# Patient Record
Sex: Male | Born: 1990
Health system: Southern US, Community
[De-identification: ages and names within clinical notes are randomized; demographics above are authoritative.]

## PROBLEM LIST (undated history)

## (undated) DIAGNOSIS — M5126 Other intervertebral disc displacement, lumbar region: Secondary | ICD-10-CM

## (undated) DIAGNOSIS — A048 Other specified bacterial intestinal infections: Secondary | ICD-10-CM

## (undated) HISTORY — DX: Other specified bacterial intestinal infections: A04.8

## (undated) HISTORY — PX: LEG SURGERY: SHX1003

## (undated) HISTORY — PX: EYE SURGERY: SHX253

---
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
## 2011-09-13 ENCOUNTER — Ambulatory Visit: Payer: Self-pay | Admitting: Family Medicine

## 2011-09-13 VITALS — BP 112/70 | HR 80 | Temp 98.7°F | Resp 18 | Ht 72.0 in | Wt 189.0 lb

## 2011-09-13 DIAGNOSIS — E860 Dehydration: Secondary | ICD-10-CM

## 2011-09-13 DIAGNOSIS — R110 Nausea: Secondary | ICD-10-CM

## 2011-09-13 DIAGNOSIS — R197 Diarrhea, unspecified: Secondary | ICD-10-CM

## 2011-09-13 DIAGNOSIS — R1110 Vomiting, unspecified: Secondary | ICD-10-CM

## 2011-09-13 LAB — POCT CBC
Granulocyte percent: 88.8 %G — AB (ref 37–80)
HCT, POC: 48.2 % (ref 43.5–53.7)
Hemoglobin: 15.2 g/dL (ref 14.1–18.1)
Lymph, poc: 1.2 (ref 0.6–3.4)
MCH, POC: 28.9 pg (ref 27–31.2)
MCHC: 31.5 g/dL — AB (ref 31.8–35.4)
MCV: 91.6 fL (ref 80–97)
MID (cbc): 0.3 (ref 0–0.9)
MPV: 10.5 fL (ref 0–99.8)
POC Granulocyte: 11.5 — AB (ref 2–6.9)
POC LYMPH PERCENT: 9.1 %L — AB (ref 10–50)
POC MID %: 2.1 %M (ref 0–12)
RDW, POC: 13.3 %
WBC: 13 10*3/uL — AB (ref 4.6–10.2)

## 2011-09-13 MED ORDER — CIPROFLOXACIN HCL 500 MG PO TABS: 500.0000 mg | ORAL_TABLET | Freq: Two times a day (BID) | ORAL | Status: AC

## 2011-09-13 MED ORDER — ONDANSETRON HCL 8 MG PO TABS: 8.0000 mg | ORAL_TABLET | Freq: Three times a day (TID) | ORAL | Status: AC | PRN

## 2011-09-13 MED ORDER — ONDANSETRON 4 MG PO TBDP
8.0000 mg | ORAL_TABLET | Freq: Once | ORAL | Status: AC
  Administered 2011-09-13: 8 mg via ORAL

## 2011-09-13 NOTE — Progress Notes (Signed)
[facility] [address] [place] [address] [phone]  Date:  09/13/2011   Name:  Jorge Ferguson   DOB:  05-Jun-1990   MRN:  [id number]  PCP:  No primary provider on file.    Chief Complaint: Fever, Dizziness, Emesis and Abdominal Pain   History of Present Illness:  Jorge Ferguson is a 21 y.o. very pleasant male patient who presents with the following:  (Today is Friday)  Here with illness for about 36 hours- he felt a little ill Wednesday evening, then yesterday he felt sweaty and sore all over. He has also been vomiting- yesterday a few times.  He had a lot of diarrhea last night.  No vomiting yet today, but he has had diarrhea about 3 x so far today.  He also notes abdominal pain in the "middle" of his stomach.  No blood in stool.  No suspicious foods.  Here with his GF- she is not ill.   He also has a runny nose, but no ST.  Occasional cough.  He has not checked his temperature.  He does have chills and aches however.  No one else sick at home He is generally healthy.  He has been able to tolerate some liquids, but ate only a banana yesterday.    There is no problem list on file for this patient.   No past medical history on file.  No past surgical history on file.  History  Substance Use Topics  . Smoking status: Current Everyday Smoker  . Smokeless tobacco: Not on file  . Alcohol Use: Not on file    No family history on file.  Allergies  Allergen Reactions  . Azithromycin Hives  . Hydrocodone Bitart (Antituss) (Hydrocodone) Rash    Pt had reaction to Hycodan syrup- rash    Medication list has been reviewed and updated.  No current outpatient prescriptions on file prior to visit.    Review of Systems:  As per HPI- otherwise negative.   Physical Examination: Filed Vitals:   09/13/11 0802  BP: 106/64  Pulse: 137  Temp: 100.9 F (38.3 C)  Resp: 18   Filed Vitals:   09/13/11 0802  Height: 6' (1.829 m)  Weight: 189 lb (85.73 kg)    Body mass index is 25.63 kg/(m^2). Ideal Body Weight: Weight in (lb) to have BMI = 25: 183.9   GEN: WDWN, NAD, Non-toxic, A & O x 3, clammy, appears mildly ill HEENT: Atraumatic, Normocephalic. Neck supple. No masses, No LAD.  Tm and oropharynx wnl, PEERL, EOMI.  Mouth a little dry Ears and Nose: No external deformity. CV: RRR- tachycardic, No M/G/R. No JVD. No thrill. No extra heart sounds. PULM: CTA B, no wheezes, crackles, rhonchi. No retractions. No resp. distress. No accessory muscle use. ABD: S, ND, +BS. No rebound. No HSM.   Mild TTP in epigastric and LLQ areas EXTR: No c/c/e NEURO Normal gait.  PSYCH: Normally interactive. Conversant. Not depressed or anxious appearing.  Calm demeanor.   Started IVF and gave 8mg  of PO zofran for nausea, vomiting, diarrhea and dehydration  Feeling a little better after first bag of IVF, pulse down to about 100 BPM.  Started second bag of IVF.   2nd set of VS taken after 2L of IVF- he felt quite a bit better.  Looked better, dizziness much better.  Reexamined abdomen- he had diffuse mild tenderness - no RLQ tenderness however.   Results for orders placed in visit on 09/13/11  POCT CBC      Component Value Range   WBC 13.0 (*) 4.6 - 10.2 K/uL   Lymph, poc 1.2  0.6 - 3.4   POC LYMPH PERCENT 9.1 (*) 10 - 50 %L   MID (cbc) 0.3  0 - 0.9   POC MID % 2.1  0 - 12 %M   POC Granulocyte 11.5 (*) 2 - 6.9   Granulocyte percent 88.8 (*) 37 - 80 %G   RBC 5.26  4.69 - 6.13 M/uL   Hemoglobin 15.2  14.1 - 18.1 g/dL   HCT, POC 95.6  21.3 - 53.7 %   MCV 91.6  80 - 97 fL   MCH, POC 28.9  27 - 31.2 pg   MCHC 31.5 (*) 31.8 - 35.4 g/dL   RDW, POC 08.6     Platelet Count, POC 192  142 - 424 K/uL   MPV 10.5  0 - 99.8 fL     Assessment and Plan: 1. Dehydration    2. Nausea  ondansetron (ZOFRAN-ODT) disintegrating tablet 8 mg, ondansetron (ZOFRAN) 8 MG tablet  3. Vomiting  POCT CBC, ciprofloxacin (CIPRO) 500 MG tablet  4. Diarrhea  ciprofloxacin (CIPRO) 500  MG tablet   Male with gastroenteritis.  Will cover with cipro due to prominent diarrhea and elevated WBC count.  Discussed small possibility of another etiology (diverticulitis) and offered CT scan.  However, he declined this for now and feels comfortable going home.  Gave a few zofran to have on hand if needed. If he is not feeling quite a bit better by tomorrow he will call or RTC- Sooner if worse.   Bland died as tolerated, plenty of fluids.  If not able to tolerate po hydration he will seek care.      Abbe Amsterdam, MD

## 2012-03-24 ENCOUNTER — Other Ambulatory Visit: Payer: Self-pay | Admitting: Family Medicine

## 2012-03-24 DIAGNOSIS — Z20828 Contact with and (suspected) exposure to other viral communicable diseases: Secondary | ICD-10-CM

## 2012-03-24 MED ORDER — OSELTAMIVIR PHOSPHATE 75 MG PO CAPS: 75.0000 mg | ORAL_CAPSULE | Freq: Every day | ORAL | Status: DC

## 2012-04-20 ENCOUNTER — Ambulatory Visit: Payer: BC Managed Care – PPO

## 2012-04-20 ENCOUNTER — Ambulatory Visit (INDEPENDENT_AMBULATORY_CARE_PROVIDER_SITE_OTHER): Payer: BC Managed Care – PPO | Admitting: Family Medicine

## 2012-04-20 DIAGNOSIS — M79609 Pain in unspecified limb: Secondary | ICD-10-CM

## 2012-04-20 DIAGNOSIS — M659 Synovitis and tenosynovitis, unspecified: Secondary | ICD-10-CM

## 2012-04-20 DIAGNOSIS — M65839 Other synovitis and tenosynovitis, unspecified forearm: Secondary | ICD-10-CM

## 2012-04-20 LAB — POCT SEDIMENTATION RATE: POCT SED RATE: 15 mm/hr (ref 0–22)

## 2012-04-20 MED ORDER — PREDNISONE 20 MG PO TABS: ORAL_TABLET | ORAL | Status: DC

## 2012-04-20 NOTE — Patient Instructions (Addendum)
Try the prednisone as directed- we hope it will relieve the pain in your hand.  If you are not better in the next couple of days please let me know.   I will be in touch with the rest of your labs

## 2012-04-20 NOTE — Progress Notes (Signed)
[facility] [address], [place] [address] [phone]  Date:  04/20/2012   Name:  Jorge Ferguson   DOB:  04/30/1990   MRN:  [id number]  PCP:  No primary provider on file.    Chief Complaint: Hand Pain and Edema   History of Present Illness:  Jorge Ferguson is a 22 y.o. very pleasant male patient who presents with the following:  He is here with a recurrent problem with his right hand.  He has had this problem in the past- it has been back for about 4 days.  This problem has occurred intermittently for about 2 years and occurs every couple of months.  His symptoms usually last between 2 and 7 days.    He used to have some aches in his neck- this is now better.  No other joint problems  The problem is in his right index, long and ring fingers.  They will feel stiff, and he will not be able to straighten them all the way.    There is no problem list on file for this patient.   History reviewed. No pertinent past medical history.  Past Surgical History  Procedure Laterality Date  . Leg surgery      History  Substance Use Topics  . Smoking status: Current Every Day Smoker  . Smokeless tobacco: Not on file  . Alcohol Use: No    Family History  Problem Relation Age of Onset  . Diabetes Mother   . Hypertension Father     Allergies  Allergen Reactions  . Azithromycin Hives  . Hydrocodone Bitart (Antituss) (Hydrocodone) Rash    Pt had reaction to Hycodan syrup- rash    Medication list has been reviewed and updated.  Current Outpatient Prescriptions on File Prior to Visit  Medication Sig Dispense Refill  . oseltamivir (TAMIFLU) 75 MG capsule Take 1 capsule (75 mg total) by mouth daily.  10 capsule  0   No current facility-administered medications on file prior to visit.    Review of Systems:  As per HPI- otherwise negative. No other joints effected  Physical Examination: Filed Vitals:   04/20/12 1546  BP: 134/79  Pulse: 77  Temp: 98.4 F  (36.9 C)  Resp: 16   Filed Vitals:   04/20/12 1546  Height: 5' 10.5" (1.791 m)  Weight: 191 lb 9.6 oz (86.909 kg)   Body mass index is 27.09 kg/(m^2). Ideal Body Weight: Weight in (lb) to have BMI = 25: 176.4  GEN: WDWN, NAD, Non-toxic, A & O x 3, healthy appearing HEENT: Atraumatic, Normocephalic. Neck supple. No masses, No LAD. Ears and Nose: No external deformity. CV: RRR, No M/G/R. No JVD. No thrill. No extra heart sounds. PULM: CTA B, no wheezes, crackles, rhonchi. No retractions. No resp. distress. No accessory muscle use EXTR: No c/c/e NEURO Normal gait.  PSYCH: Normally interactive. Conversant. Not depressed or anxious appearing.  Calm demeanor.  Right hand:  He has minimal swelling and tenderness in the index, long and ring fingers of his right hand.  These fingers are slightly stiff, and he cannot get them entirely straight without manually pulling on them.  No heat or redness to suggest infection.  He seems to have tenderness more over the flexor tendons but also over the extensors   Results for orders placed in visit on 04/20/12  POCT SEDIMENTATION RATE      Result Value Range   POCT SED RATE 15  0 - 22 mm/hr  [facility] reading (PRIMARY) by  Dr. Patsy Lager. Right hand:  Normal   RIGHT HAND - 2 VIEW  Comparison: None.  Findings: There are small irregular soft tissue calcifications in  the dorsum of the hand overlying the distal fourth metacarpal. The  osseous structures appear normal.  IMPRESSION:  Unusual soft tissue calcifications in the dorsum of the hand. Does  the patient have a palpable mass over the distal fourth metacarpal?  The possibility of foreign bodies or a partially calcified soft  tissue mass should be considered.  Assessment and Plan: Pain in hand, right - Plan: POCT SEDIMENTATION RATE, Rheumatoid factor, C-reactive protein, ANA, DG Hand 2 View Right, predniSONE (DELTASONE) 20 MG tablet  Tenosynovitis of hand - Plan: predniSONE (DELTASONE) 20 MG  tablet  Probably recurrent tenosynovitis of right hand.  Will try a course of prednisone to see if helpful, check labs for any autoimmune process.    Received OR report and called pt, LMOM.   There is some soft tissue calcification in his hand.  Uncertain etiology, will refer to hand for further evaluation.  Please call if any questions and if possible please give me an update in the next few days.    Abbe Amsterdam, MD

## 2012-04-21 LAB — C-REACTIVE PROTEIN: CRP: <0.5 mg/dL (ref <0.60)

## 2012-04-21 LAB — ANA: Anti Nuclear Antibody(ANA): NEGATIVE

## 2012-04-25 ENCOUNTER — Telehealth: Payer: Self-pay | Admitting: Family Medicine

## 2012-04-25 NOTE — Telephone Encounter (Signed)
Called to let him know the rest of his labs looked ok- his RF is borderline elevated but this is likely insignificant.  He is feeling a lot better in general with his hand

## 2012-04-26 ENCOUNTER — Encounter: Payer: Self-pay | Admitting: Family Medicine

## 2012-04-26 NOTE — Addendum Note (Signed)
Addended by: Abbe Amsterdam C on: 04/26/2012 01:00 PM   Modules accepted: Orders

## 2012-09-05 ENCOUNTER — Ambulatory Visit (INDEPENDENT_AMBULATORY_CARE_PROVIDER_SITE_OTHER): Payer: BC Managed Care – PPO | Admitting: Family Medicine

## 2012-09-05 VITALS — BP 109/73 | HR 66 | Temp 98.1°F | Resp 16 | Ht 72.0 in | Wt 187.0 lb

## 2012-09-05 DIAGNOSIS — K2970 Gastritis, unspecified, without bleeding: Secondary | ICD-10-CM

## 2012-09-05 DIAGNOSIS — K08409 Partial loss of teeth, unspecified cause, unspecified class: Secondary | ICD-10-CM

## 2012-09-05 MED ORDER — CIPROFLOXACIN HCL 250 MG PO TABS: 250.0000 mg | ORAL_TABLET | Freq: Two times a day (BID) | ORAL | Status: DC

## 2012-09-05 MED ORDER — ESOMEPRAZOLE MAGNESIUM 40 MG PO CPDR: 40.0000 mg | DELAYED_RELEASE_CAPSULE | Freq: Every day | ORAL | Status: DC

## 2012-09-05 NOTE — Progress Notes (Signed)
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   He became ill 3 days ago after drinking some OJ that had been left out for awhile.  He vomited but continued to have epigastric burning until the present.  No diarrhea.  Took ranitidine without benefit.  No belching  Objective:  NAD Chest clear Heart reg, no murmur Abdomen: tender epigastrium, no HSM, no masses, no guarding Skin: no icterus  Assessment:  Acute gastritis following food consumption.  Possible e.coli or giardia

## 2013-03-25 ENCOUNTER — Ambulatory Visit (INDEPENDENT_AMBULATORY_CARE_PROVIDER_SITE_OTHER): Payer: BC Managed Care – PPO | Admitting: Family Medicine

## 2013-03-25 VITALS — BP 110/70 | HR 69 | Temp 98.0°F | Resp 16 | Ht 71.0 in | Wt 196.0 lb

## 2013-03-25 DIAGNOSIS — M79644 Pain in right finger(s): Secondary | ICD-10-CM

## 2013-03-25 DIAGNOSIS — M79609 Pain in unspecified limb: Secondary | ICD-10-CM

## 2013-03-25 NOTE — Patient Instructions (Signed)
You have an appt with [facility] on Monday 03/29/13 at [facility]   [facility]  [address], [place], [place] [address] [phone]
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
## 2013-05-04 ENCOUNTER — Encounter: Payer: Self-pay | Admitting: Family Medicine

## 2013-12-14 ENCOUNTER — Ambulatory Visit: Payer: BC Managed Care – PPO

## 2013-12-24 ENCOUNTER — Ambulatory Visit (INDEPENDENT_AMBULATORY_CARE_PROVIDER_SITE_OTHER): Payer: BC Managed Care – PPO | Admitting: Family Medicine

## 2013-12-24 VITALS — BP 120/78 | HR 79 | Temp 98.3°F | Resp 16 | Ht 71.25 in | Wt 207.0 lb

## 2013-12-24 DIAGNOSIS — R635 Abnormal weight gain: Secondary | ICD-10-CM

## 2013-12-24 DIAGNOSIS — S39012A Strain of muscle, fascia and tendon of lower back, initial encounter: Secondary | ICD-10-CM

## 2013-12-24 MED ORDER — PREDNISONE 20 MG PO TABS: ORAL_TABLET | ORAL | Status: DC

## 2013-12-24 MED ORDER — CYCLOBENZAPRINE HCL 10 MG PO TABS: 10.0000 mg | ORAL_TABLET | Freq: Two times a day (BID) | ORAL | Status: DC | PRN

## 2013-12-24 NOTE — Progress Notes (Signed)
[facility] [address], [place] [address] [phone]  Date:  12/24/2013   Name:  Jorge Ferguson   DOB:  08/01/1990   MRN:  [id number]  PCP:  No PCP Per Patient    Chief Complaint: Back Pain   History of Present Illness:  Jorge Ferguson is a 23 y.o. very pleasant male patient who presents with the following:  He is here today with an issue with his lower back.  Last week he had noted some lower back pain off and on.  This was not too bad, but then he straightened up from a flexed position and "felt a crack."  He has pain and numbness that caused him to drop to his knees. He is seeing a chiropractor right now who has dx him with a bulging disc via plain films per his report.   He tried a TENS unit and some other device yesterday per his chiropractor which apparently showed muscle spasm.   He is also in a back brace.  He will notice numbness in one or the other leg at times.  He feels like he does not have full strength when he is walking.   No bowel or bladder control issues.  He will sometimes have pain into his legs.  Right is more than left  He was given naproxen from another UC- however his chiropractor feels this is not the best choice for him.    He notes that he is now less active as he is working more, and he has stopped going to the gym. He has gained weight that he would like to lose  Wt Readings from Last 3 Encounters:  12/24/13 207 lb (93.895 kg)  03/25/13 196 lb (88.905 kg)  09/05/12 187 lb (84.823 kg)     There are no active problems to display for this patient.   History reviewed. No pertinent past medical history.  Past Surgical History  Procedure Laterality Date  . Leg surgery      History  Substance Use Topics  . Smoking status: Current Every Day Smoker  . Smokeless tobacco: Not on file  . Alcohol Use: No    Family History  Problem Relation Age of Onset  . Diabetes Mother   . Hypertension Father     Allergies  Allergen  Reactions  . Azithromycin Hives  . Hydrocodone Bitart (Antituss) [Hydrocodone] Rash    Pt had reaction to Hycodan syrup- rash    Medication list has been reviewed and updated.  Current Outpatient Prescriptions on File Prior to Visit  Medication Sig Dispense Refill  . esomeprazole (NEXIUM) 40 MG capsule Take 1 capsule (40 mg total) by mouth daily.  10 capsule  3   No current facility-administered medications on file prior to visit.    Review of Systems:  As per HPI- otherwise negative.   Physical Examination: Filed Vitals:   12/24/13 1339  BP: 120/78  Pulse: 79  Temp: 98.3 F (36.8 C)  Resp: 16   Filed Vitals:   12/24/13 1339  Height: 5' 11.25" (1.81 m)  Weight: 207 lb (93.895 kg)   Body mass index is 28.66 kg/(m^2). Ideal Body Weight: Weight in (lb) to have BMI = 25: 180.1  GEN: WDWN, NAD, Non-toxic, A & O x 3, overweight, looks well HEENT: Atraumatic, Normocephalic. Neck supple. No masses, No LAD. Ears and Nose: No external deformity. CV: RRR, No M/G/R. No JVD. No thrill. No extra heart sounds. PULM: CTA B, no wheezes,  crackles, rhonchi. No retractions. No resp. distress. No accessory muscle use. ABD: S, NT, ND EXTR: No c/c/e NEURO Normal gait.  PSYCH: Normally interactive. Conversant. Not depressed or anxious appearing.  Calm demeanor.  Back: he has limited flexion and extension due to pain and + SLR, right more than left.  Normal strength and sensation of both LE, normal patellar DTR.     Assessment and Plan: Lumbar strain, initial encounter - Plan: cyclobenzaprine (FLEXERIL) 10 MG tablet, predniSONE (DELTASONE) 20 MG tablet  Weight gain  Will treat for a back strain and potential nerve compression with prednisone and flexeril as needed.  He will let us know if not better in the next few days- Sooner if worse.    Encouraged him to get back into his exercise program to improve his health and avoid back problems.  He will work on this .  Signed Abbe AmsterdamJessica  Copland, MD

## 2013-12-24 NOTE — Patient Instructions (Signed)
Use the prednisone as directed for any bulging disc, and the flexeril as needed for pain- remember the flexeril can make you sleepy.  Let me know if you do not feel better in the next few days It would be good idea to work on getting back in shape- once your back is well get back to the gym!    Remember no naproxen or other NSAID medications while you are on prednisone.

## 2013-12-25 ENCOUNTER — Ambulatory Visit (INDEPENDENT_AMBULATORY_CARE_PROVIDER_SITE_OTHER): Payer: BC Managed Care – PPO | Admitting: Emergency Medicine

## 2013-12-25 ENCOUNTER — Telehealth: Payer: Self-pay

## 2013-12-25 VITALS — BP 124/78 | HR 81 | Temp 98.1°F | Resp 16 | Ht 71.0 in | Wt 204.4 lb

## 2013-12-25 DIAGNOSIS — S39012A Strain of muscle, fascia and tendon of lower back, initial encounter: Secondary | ICD-10-CM

## 2013-12-25 MED ORDER — TRAMADOL HCL 50 MG PO TABS: 50.0000 mg | ORAL_TABLET | Freq: Three times a day (TID) | ORAL | Status: DC | PRN

## 2013-12-25 NOTE — Patient Instructions (Signed)
Sciatica Sciatica is pain, weakness, numbness, or tingling along the path of the sciatic nerve. The nerve starts in the lower back and runs down the back of each leg. The nerve controls the muscles in the lower leg and in the back of the knee, while also providing sensation to the back of the thigh, lower leg, and the sole of your foot. Sciatica is a symptom of another medical condition. For instance, nerve damage or certain conditions, such as a herniated disk or bone spur on the spine, pinch or put pressure on the sciatic nerve. This causes the pain, weakness, or other sensations normally associated with sciatica. Generally, sciatica only affects one side of the body. CAUSES   Herniated or slipped disc.  Degenerative disk disease.  A pain disorder involving the narrow muscle in the buttocks (piriformis syndrome).  Pelvic injury or fracture.  Pregnancy.  Tumor (rare). SYMPTOMS  Symptoms can vary from mild to very severe. The symptoms usually travel from the low back to the buttocks and down the back of the leg. Symptoms can include:  Mild tingling or dull aches in the lower back, leg, or hip.  Numbness in the back of the calf or sole of the foot.  Burning sensations in the lower back, leg, or hip.  Sharp pains in the lower back, leg, or hip.  Leg weakness.  Severe back pain inhibiting movement. These symptoms may get worse with coughing, sneezing, laughing, or prolonged sitting or standing. Also, being overweight may worsen symptoms. DIAGNOSIS  Your caregiver will perform a physical exam to look for common symptoms of sciatica. He or she may ask you to do certain movements or activities that would trigger sciatic nerve pain. Other tests may be performed to find the cause of the sciatica. These may include:  Blood tests.  X-rays.  Imaging tests, such as an MRI or CT scan. TREATMENT  Treatment is directed at the cause of the sciatic pain. Sometimes, treatment is not necessary  and the pain and discomfort goes away on its own. If treatment is needed, your caregiver may suggest:  Over-the-counter medicines to relieve pain.  Prescription medicines, such as anti-inflammatory medicine, muscle relaxants, or narcotics.  Applying heat or ice to the painful area.  Steroid injections to lessen pain, irritation, and inflammation around the nerve.  Reducing activity during periods of pain.  Exercising and stretching to strengthen your abdomen and improve flexibility of your spine. Your caregiver may suggest losing weight if the extra weight makes the back pain worse.  Physical therapy.  Surgery to eliminate what is pressing or pinching the nerve, such as a bone spur or part of a herniated disk. HOME CARE INSTRUCTIONS   Only take over-the-counter or prescription medicines for pain or discomfort as directed by your caregiver.  Apply ice to the affected area for 20 minutes, 3-4 times a day for the first 48-72 hours. Then try heat in the same way.  Exercise, stretch, or perform your usual activities if these do not aggravate your pain.  Attend physical therapy sessions as directed by your caregiver.  Keep all follow-up appointments as directed by your caregiver.  Do not wear high heels or shoes that do not provide proper support.  Check your mattress to see if it is too soft. A firm mattress may lessen your pain and discomfort. SEEK IMMEDIATE MEDICAL CARE IF:   You lose control of your bowel or bladder (incontinence).  You have increasing weakness in the lower back, pelvis, buttocks,   or legs.  You have redness or swelling of your back.  You have a burning sensation when you urinate.  You have pain that gets worse when you lie down or awakens you at night.  Your pain is worse than you have experienced in the past.  Your pain is lasting longer than 4 weeks.  You are suddenly losing weight without reason. MAKE SURE YOU:  Understand these  instructions.  Will watch your condition.  Will get help right away if you are not doing well or get worse. Document Released: 02/05/2001 Document Revised: 08/13/2011 Document Reviewed: 06/23/2011 Exit[employer] Patient Information 2015 [employer]. This information is not intended to replace advice given to you by your health care provider. Make sure you discuss any questions you have with your health care provider.  

## 2013-12-25 NOTE — Progress Notes (Signed)
[facility] [address], [place] [address] [phone]  Date:  12/25/2013   Name:  Jorge Ferguson   DOB:  07/19/1990   MRN:  [id number]  PCP:  No PCP Per Patient    Chief Complaint: Back Pain   History of Present Illness:  Jorge Ferguson Jorge Ferguson is a 23 y.o. very pleasant male patient who presents with the following:  Patient has a history of pain in low back after bending over to wash his face.  Started two weeks ago. [facility].  Chiropractor said to stop, said he has a swollen bulging disc. Seen yesterday by Dr Patsy Lageropland who put him on prednisone and flexeril. Today he has developed a numbness in the right leg from the knee medially to the ankle.  Is intermittent Pain is not relieved. No bowel or bladder control issues History of prior back problems while working at [employer].  Now works as a Radio broadcast assistantnail tech. No improvement with over the counter medications or other home remedies.  Denies other complaint or health concern today.   There are no active problems to display for this patient.   No past medical history on file.  Past Surgical History  Procedure Laterality Date  . Leg surgery      History  Substance Use Topics  . Smoking status: Current Every Day Smoker  . Smokeless tobacco: Not on file  . Alcohol Use: No    Family History  Problem Relation Age of Onset  . Diabetes Mother   . Hypertension Father     Allergies  Allergen Reactions  . Azithromycin Hives  . Hydrocodone Bitart (Antituss) [Hydrocodone] Rash    Pt had reaction to Hycodan syrup- rash    Medication list has been reviewed and updated.  Current Outpatient Prescriptions on File Prior to Visit  Medication Sig Dispense Refill  . cyclobenzaprine (FLEXERIL) 10 MG tablet Take 1 tablet (10 mg total) by mouth 2 (two) times daily as needed for muscle spasms.  30 tablet  0  . esomeprazole (NEXIUM) 40 MG capsule Take 1 capsule (40 mg total) by mouth daily.  10  capsule  3  . naproxen (NAPROSYN) 500 MG tablet Take 500 mg by mouth 2 (two) times daily with a meal.      . predniSONE (DELTASONE) 20 MG tablet Take 2 pills a day for 5 days, then 1 pill a day for 5 days  15 tablet  0   No current facility-administered medications on file prior to visit.    Review of Systems:  As per HPI, otherwise negative.    Physical Examination: Filed Vitals:   12/25/13 1512  BP: 124/78  Pulse: 81  Temp: 98.1 F (36.7 C)  Resp: 16   Filed Vitals:   12/25/13 1512  Height: 5\' 11"  (1.803 m)  Weight: 204 lb 6.4 oz (92.715 kg)   Body mass index is 28.52 kg/(m^2). Ideal Body Weight: Weight in (lb) to have BMI = 25: 178.9   GEN: WDWN, NAD, Non-toxic, Alert & Oriented x 3 HEENT: Atraumatic, Normocephalic.  Ears and Nose: No external deformity. EXTR: No clubbing/cyanosis/edema NEURO: Normal gait.  PSYCH: Normally interactive. Conversant. Not depressed or anxious appearing.  Calm demeanor.  BACK tender lumbar paraspinous bilaterally Neuro gross motor and DTR's intact  Assessment and Plan: Lumbar strain Sciatic neuralgia Add ultram Follow up in 1 week  Signed,  Phillips OdorJeffery Huck Ashworth, MD

## 2013-12-25 NOTE — Telephone Encounter (Signed)
Patient called. States after taking prednisone he is having numbess in his R foot. Please return call and advise. CB # [phone]

## 2013-12-25 NOTE — Telephone Encounter (Signed)
Spoke with pt. He was given pred 20mg  yesterday, take 2 pills qd. Yesterday he took one pill at lunch and one at dinner with no problems, this morning woke up, took 2 tabs and experienced right foot paresthesias. When I spoke with pt he was in our lobby waiting to be checked in. Spoke with Chelle. She does not think that it is coming from the pred, but from his lumbar strain. Advised pt to get checked in to be seen. Pt agreeable.

## 2013-12-31 ENCOUNTER — Ambulatory Visit (INDEPENDENT_AMBULATORY_CARE_PROVIDER_SITE_OTHER): Payer: BC Managed Care – PPO

## 2013-12-31 ENCOUNTER — Ambulatory Visit (INDEPENDENT_AMBULATORY_CARE_PROVIDER_SITE_OTHER): Payer: BC Managed Care – PPO | Admitting: Family Medicine

## 2013-12-31 VITALS — BP 112/72 | HR 68 | Temp 97.9°F | Resp 16 | Ht 71.0 in | Wt 206.8 lb

## 2013-12-31 DIAGNOSIS — M5441 Lumbago with sciatica, right side: Secondary | ICD-10-CM

## 2013-12-31 MED ORDER — TRAMADOL HCL 50 MG PO TABS: ORAL_TABLET | ORAL | Status: DC

## 2013-12-31 NOTE — Progress Notes (Signed)
[facility] [address], [place] [address] [phone]  Date:  12/31/2013   Name:  Jorge Ferguson   DOB:  04/14/1990   MRN:  [id number]  PCP:  No PCP Per Patient    Chief Complaint: Follow-up   History of Present Illness:  Jorge Ferguson Jorge Ferguson is a 23 y.o. very pleasant male patient who presents with the following:  Here today to follow-up.  He was here for a back strain a couple of times last week.  He developed back pain and sciatica in the right leg.  He has numbness in the right leg that sometimes makes it hard to walk.  He has a "cramping" pain over his right scitic notch but the main issues is the numbness.  He has a hard time standing sometimes, especially for long periods.   He is generally in good health No prior history of significant back trouble No genial or groin numbness, no bowel or bladder control issues   There are no active problems to display for this patient.   History reviewed. No pertinent past medical history.  Past Surgical History  Procedure Laterality Date  . Leg surgery      History  Substance Use Topics  . Smoking status: Current Every Day Smoker  . Smokeless tobacco: Not on file  . Alcohol Use: No    Family History  Problem Relation Age of Onset  . Diabetes Mother   . Hypertension Father     Allergies  Allergen Reactions  . Azithromycin Hives  . Hydrocodone Bitart (Antituss) [Hydrocodone] Rash    Pt had reaction to Hycodan syrup- rash    Medication list has been reviewed and updated.  Current Outpatient Prescriptions on File Prior to Visit  Medication Sig Dispense Refill  . cyclobenzaprine (FLEXERIL) 10 MG tablet Take 1 tablet (10 mg total) by mouth 2 (two) times daily as needed for muscle spasms. 30 tablet 0  . predniSONE (DELTASONE) 20 MG tablet Take 2 pills a day for 5 days, then 1 pill a day for 5 days 15 tablet 0  . traMADol (ULTRAM) 50 MG tablet Take 1 tablet (50 mg total) by mouth every 8 (eight) hours as  needed. 30 tablet 0  . naproxen (NAPROSYN) 500 MG tablet Take 500 mg by mouth 2 (two) times daily with a meal.     No current facility-administered medications on file prior to visit.    Review of Systems:  As per HPI- otherwise negative. No groin numbness, no bowel or bladder incontinence  Physical Examination: Filed Vitals:   12/31/13 1239  BP: 112/72  Pulse: 68  Temp: 97.9 F (36.6 C)  Resp: 16   Filed Vitals:   12/31/13 1239  Height: 5\' 11"  (1.803 m)  Weight: 206 lb 12.8 oz (93.804 kg)   Body mass index is 28.86 kg/(m^2). Ideal Body Weight: Weight in (lb) to have BMI = 25: 178.9  GEN: WDWN, NAD, Non-toxic, A & O x 3, looks well HEENT: Atraumatic, Normocephalic. Neck supple. No masses, No LAD. Ears and Nose: No external deformity. CV: RRR, No M/G/R. No JVD. No thrill. No extra heart sounds. PULM: CTA B, no wheezes, crackles, rhonchi. No retractions. No resp. distress. No accessory muscle use. EXTR: No c/c/e NEURO Normal gait.  PSYCH: Normally interactive. Conversant. Not depressed or anxious appearing.  Calm demeanor.  Normal strength BLE.  Decreased right patellar DTR.  Positive SLR on the right, slight on the left.  Reduced spinal flexion due  to pain. No saddle anesthesia   [facility] reading (PRIMARY) by  Dr. Patsy Lageropland. Lumbar spine 5v: negative  LUMBAR SPINE - COMPLETE 4+ VIEW  COMPARISON: Coronal and sagittal reconstructed images of the lumbar spine of June 06, 2005  FINDINGS: The lumbar vertebral bodies are preserved in height. There is disc space narrowing at L4-5 which has appeared since the previous study. There is no spondylolisthesis. No significant facet joint hypertrophy is demonstrated. The pedicles and transverse processes are intact. The observed portions of the sacrum are normal.  IMPRESSION: There is no acute bony abnormality of the lumbar spine. There is mild disc space narrowing at L4-5. Given the patient's symptoms, follow-up lumbar spine MRI  may be useful.   Assessment and Plan: Right-sided low back pain with right-sided sciatica - Plan: DG Lumbar Spine Complete, MR Lumbar Spine Wo Contrast, traMADol (ULTRAM) 50 MG tablet  Persistent mild back pain with paraesthesias in the right leg.  He is nearly done with a course of prednisone and still has sx.  Will refer for an MRI.  He will seek care if any worsening in the meantime   Signed Abbe AmsterdamJessica Poppy Mcafee, MD

## 2013-12-31 NOTE — Patient Instructions (Signed)
We will set up your MRI as soon as possible.  Use the tramadol as needed for pain If you are getting worse or having any issues with controlling your bowels or bladder seek care right away!

## 2014-01-10 ENCOUNTER — Telehealth: Payer: Self-pay

## 2014-01-10 DIAGNOSIS — M5441 Lumbago with sciatica, right side: Secondary | ICD-10-CM

## 2014-01-10 NOTE — Telephone Encounter (Signed)
Pt states he cannot get in for his mri until the end of the month and the tramadol is just not working,please advise  Best phone for pt is [phone]   [facility] [place]

## 2014-01-11 MED ORDER — HYDROCODONE-ACETAMINOPHEN 5-325 MG PO TABS: 1.0000 | ORAL_TABLET | Freq: Three times a day (TID) | ORAL | Status: DC | PRN

## 2014-01-11 NOTE — Telephone Encounter (Signed)
Called him back and was able to speak with him.  He is still having pain down his leg, and his MRI is not for another 10 days. The tramadol does help, but sometimes he still has a lot of pain at night.   He states he is using some leftover hydrocodone/apap pills that he had from a previous time. These are helping and he has not noted any sign of an allergic reaction.  No itching, rash, SOB.  Also states that his allergy to hycodan in the past consisted of itching and then he developed a rash after he scratched his skin. He never had any respiratory sx.  Will rx vicodin for him; he will pick this up at clinic.  If any SE he will stop taking it.

## 2014-01-11 NOTE — Telephone Encounter (Signed)
He was seen for this issue originally on 10/30.  Treated with prednisone.  Persistent sx on follow-up so we have referred him for MRI.  [facility]

## 2014-01-11 NOTE — Telephone Encounter (Signed)
Pt states he had called the other day regarding the TRAMADOL isn't working and would like to have something else stronger until his referral next week, please call [phone]       [facility] [address]

## 2014-01-19 ENCOUNTER — Telehealth: Payer: Self-pay | Admitting: Family Medicine

## 2014-01-19 ENCOUNTER — Ambulatory Visit (HOSPITAL_COMMUNITY)
Admission: RE | Admit: 2014-01-19 | Discharge: 2014-01-19 | Disposition: A | Discharge status: Home / Self Care | Payer: BC Managed Care – PPO | Source: Ambulatory Visit | Attending: Family Medicine | Admitting: Family Medicine

## 2014-01-19 DIAGNOSIS — M5136 Other intervertebral disc degeneration, lumbar region: Secondary | ICD-10-CM

## 2014-01-19 DIAGNOSIS — M5126 Other intervertebral disc displacement, lumbar region: Secondary | ICD-10-CM

## 2014-01-19 DIAGNOSIS — R200 Anesthesia of skin: Secondary | ICD-10-CM | POA: Insufficient documentation

## 2014-01-19 DIAGNOSIS — M5441 Lumbago with sciatica, right side: Secondary | ICD-10-CM

## 2014-01-19 DIAGNOSIS — M5127 Other intervertebral disc displacement, lumbosacral region: Secondary | ICD-10-CM | POA: Insufficient documentation

## 2014-01-19 NOTE — Telephone Encounter (Signed)
Called pt and LMOM.  I received his MRI report- he does have 2 bulging discs. Doubt he will need surgery but he may benefit from injections.  Will refer to see Dr. Ethelene Halamos.  Let me know if any trouble in the meantime.  Also please pick up a copy of his MRI on a CD; call and ask the imaging facility to make a CD for him

## 2014-01-21 ENCOUNTER — Telehealth: Payer: Self-pay | Admitting: Family Medicine

## 2014-01-21 DIAGNOSIS — M5441 Lumbago with sciatica, right side: Secondary | ICD-10-CM

## 2014-01-21 MED ORDER — TRAMADOL HCL 50 MG PO TABS: ORAL_TABLET | ORAL | Status: DC

## 2014-01-21 NOTE — Telephone Encounter (Signed)
Called him- he is doing ok.  He still has some pain and is using tramadol as needed.  Will refer to see Dr. Ethelene Halamos.  Called and asked [facility] to make up a CD for him to take.  Will refill his tramadol

## 2014-02-11 ENCOUNTER — Ambulatory Visit: Payer: Self-pay | Admitting: Orthopedic Surgery

## 2014-02-12 ENCOUNTER — Ambulatory Visit: Payer: Self-pay | Admitting: Orthopedic Surgery

## 2014-02-12 NOTE — H&P (Signed)
Jorge Ferguson is an 23 y.o. male.   Chief Complaint: back and right leg pain, numbness HPI: The patient is a 23 year old male who presents with back pain. The patient is here today in referral from [facility]. The patient reports low back symptoms which began 3 month(s) ago in association with an established activity (bending over the sink washing his face). Symptoms are reported to be located in the low back and Symptoms include numbness. The pain radiates to the right lower leg. The patient feels as if the symptoms are improving. Symptoms are exacerbated by recumbency (stretching the leg out). Prior to being seen today the patient was previously evaluated in urgent care. Past evaluation has included MRI of the lumbar spine. Past treatment has included nonsteroidal anti-inflammatory drugs, non-opioid analgesics, opioid analgesics and muscle relaxants.  [name]. Chief complaint is numbness in the right medial calf, weak leg, pain in the leg. This is a pleasant 23 year old male who is kindly referred here by Jorge Ferguson, [facility] for evaluation of the above mentioned symptoms. The patient reports three months ago he bent over to wash his face in the sink and had acute back pain and leg pain and numbness. He was seen by Dr. Dallas Ferguson and placed on a Prednisone Dosepak and he reports he has had some numbness into the right leg, weakness in his quad. He is unable to go up and down stairs or squat down. He is having significant symptoms associated with that. He underwent an MRI due to persistent symptoms. [name]. It took some time as they were filled. Disc herniation small paracentral to the right foramen with mass effect on the right L5 nerve root and abutting the right foraminal L4 root. This was on 01/19/14. He was kindly referred here for persistent pain predominantly and numbness into the leg.  Review of systems is negative for fevers,  chest pain, shortness of breath, unexplained recent weight loss, loss of bowel or bladder function, burning with urination, joint swelling, rashes, weakness or numbness, difficulty with balance, easy bruising, excessive thirst or frequent urination.  Patient's pain drawing is organic.  No past medical history on file.  Past Surgical History  Procedure Laterality Date  . Leg surgery      Family History  Problem Relation Age of Onset  . Diabetes Mother   . Hypertension Father    Social History:  reports that he has been smoking.  He does not have any smokeless tobacco history on file. He reports that he does not drink alcohol or use illicit drugs.  Allergies:  Allergies  Allergen Reactions  . Azithromycin Hives  . Hydrocodone Bitart (Antituss) [Hydrocodone] Rash    Pt had reaction to Hycodan syrup- rash. Pt reports he is able to take vicodin without any sign of allergy     (Not in a hospital admission)  No results found for this or any previous visit (from the past 48 hour(s)). No results found.  Review of Systems  Constitutional: Negative.   HENT: Negative.   Eyes: Negative.   Respiratory: Negative.   Cardiovascular: Negative.   Gastrointestinal: Negative.   Genitourinary: Negative.   Musculoskeletal: Positive for back pain.  Skin: Negative.   Neurological: Positive for sensory change and focal weakness.  Psychiatric/Behavioral: Negative.     There were no vitals taken for this visit. Physical Exam  Constitutional: He is oriented to person, place, and time.  He appears well-developed and well-nourished.  HENT:  Head: Normocephalic and atraumatic.  Eyes: Conjunctivae and EOM are normal. Pupils are equal, round, and reactive to light.  Neck: Normal range of motion. Neck supple.  Cardiovascular: Normal rate and regular rhythm.   Respiratory: Effort normal and breath sounds normal.  GI: Soft. Bowel sounds are normal.  Musculoskeletal:  On exam he has decreased  sensation in the L4 dermatome on the right. He has near absent knee reflex on the right. He has quad atrophy, quad weakness 4+/5 on the right compared to the left. Trace EHL weakness is noted on the right compared to the left. Straight leg raise produces buttock, thigh and calf pain.  Neurological: He is alert and oriented to person, place, and time. He has normal reflexes.  Skin: Skin is warm and dry.  Psychiatric: He has a normal mood and affect.    Three view x-rays AP and lateral flexion and extension demonstrate disc space narrowing at L4-5, no instability on flexion and extension. Hips are unremarkable. Outside MRI demonstrates disc herniation at L4-5 paracentral to the right with what appears to be a significant herniation into the foramen affecting the 4 root.  Assessment/Plan HNP L4-5 right L4 radiculopathy with myotomal weakness, dermatomal dysesthesias, disc herniation at L4-5 foraminal. Significant mass effect on the 4 root into the foramen.  Discussed with the radiologist, Dr. Chris Ferguson for an additional opinion concerning the disc herniation that appears larger than initially interpreted especially with his clinical evidence of an L4 nerve root deficit. He confirmed early significant neurocompression, hemorrhage or disc extending into the foramen. We discussed options at this point in time due to three months of symptoms. He does have ongoing radicular pain. He does have significant dermatomal decreased sensation and myotomal weakness. It would be appropriate at this point to consider decompression of the 4 root and also the 5 root. We discussed the possibility he may receive no benefit from the decompression given the duration of his neurocompression. He understands that. He also has no back pain, no previous problems. He may require a fusion in the future if necessary. Discussed time out of work. He sits most of the time, he is a manicurist. He has had a history of trauma, no DVT, PE.  Proceed as soon as possible. He has mild disc herniation at L5-S1 to the left. He has failed conservative treatment at this point. We will proceed accordingly. I appreciate the kind referral.  I had an extensive discussion of the risks and benefits of the lumbar decompression with the patient including bleeding, infection, damage to neurovascular structures, epidural fibrosis, CSF leak requiring repair. We also discussed increase in pain, adjacent segment disease, recurrent disc herniation, need for future surgery including repeat decompression and/or fusion. We also discussed risks of postoperative hematoma, paralysis, anesthetic complications including DVT, PE, death, cardiopulmonary dysfunction. In addition, the perioperative and postoperative courses were discussed in detail including the rehabilitative time and return to functional activity and work. I provided the patient with an illustrated handout and utilized the appropriate surgical models.  Plan microlumbar decompression L4-5 right  Neftaly Swiss M. PA-C for Dr. Beane 02/12/2014, 6:22 PM    

## 2014-02-15 ENCOUNTER — Other Ambulatory Visit (HOSPITAL_COMMUNITY): Payer: Self-pay | Admitting: *Deleted

## 2014-02-15 NOTE — Patient Instructions (Addendum)
Jorge Ferguson  02/15/2014   Your procedure is scheduled on: 02/23/14   [facility]  Entrance and follow signs to               Short Stay Center at 9:15  AM.   Call this number if you have problems the morning of surgery [phone]   Remember:  Do not eat food or drink liquids :After Midnight.     Take these medicines the morning of surgery with A SIP OF WATER: NONE                              You may not have any metal on your body including hair pins and              piercings  Do not wear jewelry, make-up, lotions, powders or perfumes.             Do not wear nail polish.  Do not shave  48 hours prior to surgery.              Men may shave face and neck.   Do not bring valuables to the hospital. [facility] IS NOT             RESPONSIBLE   FOR VALUABLES.  Contacts, dentures or bridgework may not be worn into surgery.  Leave suitcase in the car. After surgery it may be brought to your room.     Patients discharged the day of surgery will not be allowed to drive home.  Name and phone number of your driver:  Special Instructions: N/A              Please read over the following fact sheets you were given: _____________________________________________________________________                                                     [facility] - PREPARING FOR SURGERY  Before surgery, you can play an important role.  Because skin is not sterile, your skin needs to be as free of germs as possible.  You can reduce the number of germs on your skin by washing with CHG (chlorahexidine gluconate) soap before surgery.  CHG is an antiseptic cleaner which kills germs and bonds with the skin to continue killing germs even after washing. Please DO NOT use if you have an allergy to CHG or antibacterial soaps.  If your skin becomes reddened/irritated stop using the CHG and inform your nurse when you arrive at Short Stay. Do not shave (including legs and underarms)  for at least 48 hours prior to the first CHG shower.  You may shave your face. Please follow these instructions carefully:   1.  Shower with CHG Soap the night before surgery and the  morning of Surgery.   2.  If you choose to wash your hair, wash your hair first as usual with your  normal  Shampoo.   3.  After you shampoo, rinse your hair and body thoroughly to remove the  shampoo.  4.  Use CHG as you would any other liquid soap.  You can apply chg directly  to the skin and wash . Gently wash with scrungie or clean wascloth    5.  Apply the CHG Soap to your body ONLY FROM THE NECK DOWN.   Do not use on open                           Wound or open sores. Avoid contact with eyes, ears mouth and genitals (private parts).                        Genitals (private parts) with your normal soap.              6.  Wash thoroughly, paying special attention to the area where your surgery  will be performed.   7.  Thoroughly rinse your body with warm water from the neck down.   8.  DO NOT shower/wash with your normal soap after using and rinsing off  the CHG Soap .                9.  Pat yourself dry with a clean towel.             10.  Wear clean pajamas.             11.  Place clean sheets on your bed the night of your first shower and do not  sleep with pets.  Day of Surgery : Do not apply any lotions/deodorants the morning of surgery.  Please wear clean clothes to the hospital/surgery center.  FAILURE TO FOLLOW THESE INSTRUCTIONS MAY RESULT IN THE CANCELLATION OF YOUR SURGERY    PATIENT SIGNATURE_________________________________  ______________________________________________________________________     Jorge Ferguson  An incentive spirometer is a tool that can help keep your lungs clear and active. This tool measures how well you are filling your lungs with each breath. Taking long deep breaths may help reverse or decrease the chance  of developing breathing (pulmonary) problems (especially infection) following:  A long period of time when you are unable to move or be active. BEFORE THE PROCEDURE   If the spirometer includes an indicator to show your best effort, your nurse or respiratory therapist will set it to a desired goal.  If possible, sit up straight or lean slightly forward. Try not to slouch.  Hold the incentive spirometer in an upright position. INSTRUCTIONS FOR USE  1. Sit on the edge of your bed if possible, or sit up as far as you can in bed or on a chair. 2. Hold the incentive spirometer in an upright position. 3. Breathe out normally. 4. Place the mouthpiece in your mouth and seal your lips tightly around it. 5. Breathe in slowly and as deeply as possible, raising the piston or the ball toward the top of the column. 6. Hold your breath for 3-5 seconds or for as long as possible. Allow the piston or ball to fall to the bottom of the column. 7. Remove the mouthpiece from your mouth and breathe out normally. 8. Rest for a few seconds and repeat Steps 1 through 7 at least 10 times every 1-2 hours when you are awake. Take your time and take a few normal breaths between deep breaths. 9. The spirometer may include an indicator to show your best effort. Use the indicator as a goal to work toward during  each repetition. 10. After each set of 10 deep breaths, practice coughing to be sure your lungs are clear. If you have an incision (the cut made at the time of surgery), support your incision when coughing by placing a pillow or rolled up towels firmly against it. Once you are able to get out of bed, walk around indoors and cough well. You may stop using the incentive spirometer when instructed by your caregiver.  RISKS AND COMPLICATIONS  Take your time so you do not get dizzy or light-headed.  If you are in pain, you may need to take or ask for pain medication before doing incentive spirometry. It is harder to  take a deep breath if you are having pain. AFTER USE  Rest and breathe slowly and easily.  It can be helpful to keep track of a log of your progress. Your caregiver can provide you with a simple table to help with this. If you are using the spirometer at home, follow these instructions: [place] IF:   You are having difficultly using the spirometer.  You have trouble using the spirometer as often as instructed.  Your pain medication is not giving enough relief while using the spirometer.  You develop fever of 100.5 F (38.1 C) or higher. SEEK IMMEDIATE MEDICAL CARE IF:   You cough up bloody sputum that had not been present before.  You develop fever of 102 F (38.9 C) or greater.  You develop worsening pain at or near the incision site. MAKE SURE YOU:   Understand these instructions.  Will watch your condition.  Will get help right away if you are not doing well or get worse. Document Released: 06/24/2006 Document Revised: 05/06/2011 Document Reviewed: 08/25/2006 [facility] Patient Information 2014 [place], [place].   ________________________________________________________________________

## 2014-02-16 ENCOUNTER — Encounter (HOSPITAL_COMMUNITY)
Admission: RE | Admit: 2014-02-16 | Discharge: 2014-02-16 | Disposition: A | Discharge status: Home / Self Care | Payer: BC Managed Care – PPO | Source: Ambulatory Visit | Attending: Specialist | Admitting: Specialist

## 2014-02-16 ENCOUNTER — Encounter (HOSPITAL_COMMUNITY): Payer: Self-pay

## 2014-02-16 ENCOUNTER — Ambulatory Visit (HOSPITAL_COMMUNITY)
Admission: RE | Admit: 2014-02-16 | Discharge: 2014-02-16 | Disposition: A | Discharge status: Home / Self Care | Payer: BC Managed Care – PPO | Source: Ambulatory Visit | Attending: Orthopedic Surgery | Admitting: Orthopedic Surgery

## 2014-02-16 DIAGNOSIS — Z01818 Encounter for other preprocedural examination: Secondary | ICD-10-CM | POA: Diagnosis not present

## 2014-02-16 DIAGNOSIS — M5126 Other intervertebral disc displacement, lumbar region: Secondary | ICD-10-CM | POA: Insufficient documentation

## 2014-02-16 HISTORY — DX: Other intervertebral disc displacement, lumbar region: M51.26

## 2014-02-16 LAB — BASIC METABOLIC PANEL
ANION GAP: 5 (ref 5–15)
BUN: 13 mg/dL (ref 6–23)
CO2: 30 mmol/L (ref 19–32)
Calcium: 9.9 mg/dL (ref 8.4–10.5)
Chloride: 105 mEq/L (ref 96–112)
Creatinine, Ser: 0.62 mg/dL (ref 0.50–1.35)
GFR calc Af Amer: >90 mL/min (ref >90)
GFR calc non Af Amer: >90 mL/min (ref >90)
Glucose, Bld: 97 mg/dL (ref 70–99)
Potassium: 5.3 mmol/L — ABNORMAL HIGH (ref 3.5–5.1)
Sodium: 140 mmol/L (ref 135–145)

## 2014-02-16 LAB — CBC
HEMATOCRIT: 45.5 % (ref 39.0–52.0)
Hemoglobin: 15 g/dL (ref 13.0–17.0)
MCH: 29 pg (ref 26.0–34.0)
MCHC: 33 g/dL (ref 30.0–36.0)
MCV: 88 fL (ref 78.0–100.0)
Platelets: 206 10*3/uL (ref 150–400)
RBC: 5.17 MIL/uL (ref 4.22–5.81)
RDW: 12.2 % (ref 11.5–15.5)
WBC: 6.2 10*3/uL (ref 4.0–10.5)

## 2014-02-16 LAB — SURGICAL PCR SCREEN
MRSA, PCR: NEGATIVE
Staphylococcus aureus: POSITIVE — AB

## 2014-02-21 ENCOUNTER — Ambulatory Visit: Payer: Self-pay | Admitting: Orthopedic Surgery

## 2014-02-23 ENCOUNTER — Encounter (HOSPITAL_COMMUNITY)
Admission: RE | Disposition: A | Discharge status: Home / Self Care | Payer: Self-pay | Source: Ambulatory Visit | Attending: Specialist

## 2014-02-23 ENCOUNTER — Ambulatory Visit (HOSPITAL_COMMUNITY): Payer: BC Managed Care – PPO | Admitting: Registered Nurse

## 2014-02-23 ENCOUNTER — Ambulatory Visit (HOSPITAL_COMMUNITY): Payer: BC Managed Care – PPO

## 2014-02-23 ENCOUNTER — Ambulatory Visit (HOSPITAL_COMMUNITY)
Admission: RE | Admit: 2014-02-23 | Discharge: 2014-02-24 | Disposition: A | Discharge status: Home / Self Care | Payer: BC Managed Care – PPO | Source: Ambulatory Visit | Attending: Specialist | Admitting: Specialist

## 2014-02-23 ENCOUNTER — Encounter (HOSPITAL_COMMUNITY): Payer: Self-pay | Admitting: Registered Nurse

## 2014-02-23 DIAGNOSIS — M5116 Intervertebral disc disorders with radiculopathy, lumbar region: Secondary | ICD-10-CM | POA: Diagnosis not present

## 2014-02-23 DIAGNOSIS — Z888 Allergy status to other drugs, medicaments and biological substances status: Secondary | ICD-10-CM | POA: Insufficient documentation

## 2014-02-23 DIAGNOSIS — Z880 Allergy status to penicillin: Secondary | ICD-10-CM | POA: Diagnosis not present

## 2014-02-23 DIAGNOSIS — M5126 Other intervertebral disc displacement, lumbar region: Secondary | ICD-10-CM | POA: Diagnosis present

## 2014-02-23 DIAGNOSIS — F17210 Nicotine dependence, cigarettes, uncomplicated: Secondary | ICD-10-CM | POA: Diagnosis not present

## 2014-02-23 DIAGNOSIS — Z419 Encounter for procedure for purposes other than remedying health state, unspecified: Secondary | ICD-10-CM

## 2014-02-23 HISTORY — PX: LUMBAR LAMINECTOMY/DECOMPRESSION MICRODISCECTOMY: SHX5026

## 2014-02-23 SURGERY — LUMBAR LAMINECTOMY/DECOMPRESSION MICRODISCECTOMY 1 LEVEL
Anesthesia: General | Laterality: Right

## 2014-02-23 MED ORDER — PHENOL 1.4 % MT LIQD: 1.0000 | OROMUCOSAL | Status: DC | PRN

## 2014-02-23 MED ORDER — SODIUM CHLORIDE 0.9 % IR SOLN
Status: DC | PRN
  Administered 2014-02-23: 500 mL

## 2014-02-23 MED ORDER — METHOCARBAMOL 500 MG PO TABS
500.0000 mg | ORAL_TABLET | Freq: Four times a day (QID) | ORAL | Status: DC | PRN
  Administered 2014-02-24: 500 mg via ORAL
  Filled 2014-02-23 (×2): qty 1

## 2014-02-23 MED ORDER — CEFAZOLIN SODIUM-DEXTROSE 2-3 GM-% IV SOLR
2.0000 g | INTRAVENOUS | Status: AC
  Administered 2014-02-23: 2 g via INTRAVENOUS

## 2014-02-23 MED ORDER — LIDOCAINE HCL (CARDIAC) 20 MG/ML IV SOLN
INTRAVENOUS | Status: DC | PRN
  Administered 2014-02-23: 75 mg via INTRAVENOUS
  Administered 2014-02-23: 25 mg via INTRATRACHEAL

## 2014-02-23 MED ORDER — PROPOFOL 10 MG/ML IV BOLUS
INTRAVENOUS | Status: AC
  Filled 2014-02-23: qty 20

## 2014-02-23 MED ORDER — OXYCODONE-ACETAMINOPHEN 5-325 MG PO TABS: 1.0000 | ORAL_TABLET | ORAL | Status: DC | PRN

## 2014-02-23 MED ORDER — ACETAMINOPHEN 325 MG PO TABS: 650.0000 mg | ORAL_TABLET | ORAL | Status: DC | PRN

## 2014-02-23 MED ORDER — NEOSTIGMINE METHYLSULFATE 10 MG/10ML IV SOLN
INTRAVENOUS | Status: DC | PRN
  Administered 2014-02-23: 3 mg via INTRAVENOUS

## 2014-02-23 MED ORDER — PROMETHAZINE HCL 25 MG/ML IJ SOLN: 6.2500 mg | INTRAMUSCULAR | Status: DC | PRN

## 2014-02-23 MED ORDER — MEPERIDINE HCL 50 MG/ML IJ SOLN: 6.2500 mg | INTRAMUSCULAR | Status: DC | PRN

## 2014-02-23 MED ORDER — SODIUM CHLORIDE 0.9 % IV SOLN: 250.0000 mL | INTRAVENOUS | Status: DC

## 2014-02-23 MED ORDER — NEOSTIGMINE METHYLSULFATE 10 MG/10ML IV SOLN
INTRAVENOUS | Status: AC
  Filled 2014-02-23: qty 1

## 2014-02-23 MED ORDER — MENTHOL 3 MG MT LOZG: 1.0000 | LOZENGE | OROMUCOSAL | Status: DC | PRN

## 2014-02-23 MED ORDER — METHOCARBAMOL 500 MG PO TABS: 500.0000 mg | ORAL_TABLET | Freq: Three times a day (TID) | ORAL | Status: DC | PRN

## 2014-02-23 MED ORDER — CLINDAMYCIN PHOSPHATE 900 MG/50ML IV SOLN
INTRAVENOUS | Status: AC
  Filled 2014-02-23: qty 50

## 2014-02-23 MED ORDER — ONDANSETRON HCL 4 MG/2ML IJ SOLN
INTRAMUSCULAR | Status: DC | PRN
  Administered 2014-02-23: 4 mg via INTRAVENOUS

## 2014-02-23 MED ORDER — HYDROMORPHONE HCL 1 MG/ML IJ SOLN
INTRAMUSCULAR | Status: AC
  Filled 2014-02-23: qty 1

## 2014-02-23 MED ORDER — BUPIVACAINE-EPINEPHRINE (PF) 0.5% -1:200000 IJ SOLN
INTRAMUSCULAR | Status: AC
  Filled 2014-02-23: qty 30

## 2014-02-23 MED ORDER — SODIUM CHLORIDE 0.9 % IR SOLN
Status: AC
  Filled 2014-02-23: qty 1

## 2014-02-23 MED ORDER — DOCUSATE SODIUM 100 MG PO CAPS
100.0000 mg | ORAL_CAPSULE | Freq: Two times a day (BID) | ORAL | Status: DC
  Administered 2014-02-23 – 2014-02-24 (×2): 100 mg via ORAL

## 2014-02-23 MED ORDER — METHOCARBAMOL 1000 MG/10ML IJ SOLN
500.0000 mg | Freq: Four times a day (QID) | INTRAVENOUS | Status: DC | PRN
  Administered 2014-02-23: 500 mg via INTRAVENOUS
  Filled 2014-02-23 (×2): qty 5

## 2014-02-23 MED ORDER — OXYCODONE-ACETAMINOPHEN 5-325 MG PO TABS
1.0000 | ORAL_TABLET | ORAL | Status: DC | PRN
  Administered 2014-02-23 – 2014-02-24 (×5): 1 via ORAL
  Filled 2014-02-23 (×3): qty 1
  Filled 2014-02-23: qty 2
  Filled 2014-02-23 (×2): qty 1

## 2014-02-23 MED ORDER — ROCURONIUM BROMIDE 100 MG/10ML IV SOLN
INTRAVENOUS | Status: DC | PRN
  Administered 2014-02-23: 5 mg via INTRAVENOUS
  Administered 2014-02-23: 40 mg via INTRAVENOUS
  Administered 2014-02-23: 5 mg via INTRAVENOUS

## 2014-02-23 MED ORDER — SODIUM CHLORIDE 0.9 % IJ SOLN: 3.0000 mL | INTRAMUSCULAR | Status: DC | PRN

## 2014-02-23 MED ORDER — CEFAZOLIN SODIUM-DEXTROSE 2-3 GM-% IV SOLR
2.0000 g | Freq: Three times a day (TID) | INTRAVENOUS | Status: AC
  Administered 2014-02-23 – 2014-02-24 (×3): 2 g via INTRAVENOUS
  Filled 2014-02-23 (×3): qty 50

## 2014-02-23 MED ORDER — FENTANYL CITRATE 0.05 MG/ML IJ SOLN
INTRAMUSCULAR | Status: DC | PRN
  Administered 2014-02-23 (×2): 50 ug via INTRAVENOUS
  Administered 2014-02-23: 150 ug via INTRAVENOUS

## 2014-02-23 MED ORDER — KCL IN DEXTROSE-NACL 20-5-0.45 MEQ/L-%-% IV SOLN
INTRAVENOUS | Status: DC
  Administered 2014-02-23: 50 mL/h via INTRAVENOUS
  Filled 2014-02-23 (×2): qty 1000

## 2014-02-23 MED ORDER — CEFAZOLIN SODIUM-DEXTROSE 2-3 GM-% IV SOLR
INTRAVENOUS | Status: AC
  Filled 2014-02-23: qty 50

## 2014-02-23 MED ORDER — MIDAZOLAM HCL 5 MG/5ML IJ SOLN
INTRAMUSCULAR | Status: DC | PRN
  Administered 2014-02-23: 2 mg via INTRAVENOUS

## 2014-02-23 MED ORDER — LACTATED RINGERS IV SOLN: INTRAVENOUS | Status: DC

## 2014-02-23 MED ORDER — GLYCOPYRROLATE 0.2 MG/ML IJ SOLN
INTRAMUSCULAR | Status: AC
  Filled 2014-02-23: qty 3

## 2014-02-23 MED ORDER — DEXAMETHASONE SODIUM PHOSPHATE 10 MG/ML IJ SOLN
INTRAMUSCULAR | Status: AC
  Filled 2014-02-23: qty 1

## 2014-02-23 MED ORDER — HYDROMORPHONE HCL 1 MG/ML IJ SOLN: 0.5000 mg | INTRAMUSCULAR | Status: DC | PRN

## 2014-02-23 MED ORDER — DOCUSATE SODIUM 100 MG PO CAPS: 100.0000 mg | ORAL_CAPSULE | Freq: Two times a day (BID) | ORAL | Status: DC | PRN

## 2014-02-23 MED ORDER — ACETAMINOPHEN 650 MG RE SUPP: 650.0000 mg | RECTAL | Status: DC | PRN

## 2014-02-23 MED ORDER — THROMBIN 5000 UNITS EX SOLR
OROMUCOSAL | Status: DC | PRN
  Administered 2014-02-23: 12:00:00 via TOPICAL

## 2014-02-23 MED ORDER — SODIUM CHLORIDE 0.9 % IJ SOLN: 3.0000 mL | Freq: Two times a day (BID) | INTRAMUSCULAR | Status: DC

## 2014-02-23 MED ORDER — BISACODYL 5 MG PO TBEC: 5.0000 mg | DELAYED_RELEASE_TABLET | Freq: Every day | ORAL | Status: DC | PRN

## 2014-02-23 MED ORDER — ALUM & MAG HYDROXIDE-SIMETH 200-200-20 MG/5ML PO SUSP: 30.0000 mL | Freq: Four times a day (QID) | ORAL | Status: DC | PRN

## 2014-02-23 MED ORDER — LACTATED RINGERS IV SOLN
INTRAVENOUS | Status: DC
Start: 2014-02-23 — End: 2014-02-23
  Administered 2014-02-23: 1000 mL via INTRAVENOUS

## 2014-02-23 MED ORDER — ONDANSETRON HCL 4 MG/2ML IJ SOLN
INTRAMUSCULAR | Status: AC
  Filled 2014-02-23: qty 2

## 2014-02-23 MED ORDER — FENTANYL CITRATE 0.05 MG/ML IJ SOLN
INTRAMUSCULAR | Status: AC
  Filled 2014-02-23: qty 5

## 2014-02-23 MED ORDER — GLYCOPYRROLATE 0.2 MG/ML IJ SOLN
INTRAMUSCULAR | Status: DC | PRN
  Administered 2014-02-23: 0.4 mg via INTRAVENOUS

## 2014-02-23 MED ORDER — BUPIVACAINE-EPINEPHRINE 0.5% -1:200000 IJ SOLN
INTRAMUSCULAR | Status: DC | PRN
  Administered 2014-02-23: 13 mL

## 2014-02-23 MED ORDER — LIDOCAINE HCL (CARDIAC) 20 MG/ML IV SOLN
INTRAVENOUS | Status: AC
  Filled 2014-02-23: qty 5

## 2014-02-23 MED ORDER — ONDANSETRON HCL 4 MG/2ML IJ SOLN: 4.0000 mg | INTRAMUSCULAR | Status: DC | PRN

## 2014-02-23 MED ORDER — MAGNESIUM CITRATE PO SOLN: 1.0000 | Freq: Once | ORAL | Status: AC | PRN

## 2014-02-23 MED ORDER — HYDROMORPHONE HCL 1 MG/ML IJ SOLN
0.2500 mg | INTRAMUSCULAR | Status: DC | PRN
  Administered 2014-02-23 (×2): 0.5 mg via INTRAVENOUS

## 2014-02-23 MED ORDER — SENNOSIDES-DOCUSATE SODIUM 8.6-50 MG PO TABS: 1.0000 | ORAL_TABLET | Freq: Every evening | ORAL | Status: DC | PRN

## 2014-02-23 MED ORDER — PROPOFOL 10 MG/ML IV BOLUS
INTRAVENOUS | Status: DC | PRN
Start: 2014-02-23 — End: 2014-02-23
  Administered 2014-02-23: 200 mg via INTRAVENOUS

## 2014-02-23 MED ORDER — THROMBIN 5000 UNITS EX SOLR
CUTANEOUS | Status: AC
  Filled 2014-02-23: qty 10000

## 2014-02-23 MED ORDER — MIDAZOLAM HCL 2 MG/2ML IJ SOLN
INTRAMUSCULAR | Status: AC
  Filled 2014-02-23: qty 2

## 2014-02-23 MED ORDER — LACTATED RINGERS IV SOLN
INTRAVENOUS | Status: DC | PRN
  Administered 2014-02-23 (×2): via INTRAVENOUS

## 2014-02-23 MED ORDER — HYDROCODONE-ACETAMINOPHEN 5-325 MG PO TABS: 1.0000 | ORAL_TABLET | ORAL | Status: DC | PRN

## 2014-02-23 MED ORDER — ACETAMINOPHEN 10 MG/ML IV SOLN
1000.0000 mg | Freq: Once | INTRAVENOUS | Status: AC
  Administered 2014-02-23: 1000 mg via INTRAVENOUS
  Filled 2014-02-23: qty 100

## 2014-02-23 MED ORDER — CLINDAMYCIN PHOSPHATE 900 MG/50ML IV SOLN
900.0000 mg | INTRAVENOUS | Status: AC
  Administered 2014-02-23: 900 mg via INTRAVENOUS

## 2014-02-23 MED ORDER — ROCURONIUM BROMIDE 100 MG/10ML IV SOLN
INTRAVENOUS | Status: AC
  Filled 2014-02-23: qty 1

## 2014-02-23 SURGICAL SUPPLY — 43 items
BAG ZIPLOCK 12X15 (MISCELLANEOUS) IMPLANT
CLEANER TIP ELECTROSURG 2X2 (MISCELLANEOUS) ×2 IMPLANT
CLOTH 2% CHLOROHEXIDINE 3PK (PERSONAL CARE ITEMS) ×2 IMPLANT
DRAPE MICROSCOPE LEICA (MISCELLANEOUS) ×2 IMPLANT
DRAPE POUCH INSTRU U-SHP 10X18 (DRAPES) ×2 IMPLANT
DRAPE SURG 17X11 SM STRL (DRAPES) ×2 IMPLANT
DRAPE UTILITY XL STRL (DRAPES) ×2 IMPLANT
DRSG AQUACEL AG ADV 3.5X 4 (GAUZE/BANDAGES/DRESSINGS) ×2 IMPLANT
DRSG AQUACEL AG ADV 3.5X 6 (GAUZE/BANDAGES/DRESSINGS) ×2 IMPLANT
DURAPREP 26ML APPLICATOR (WOUND CARE) ×2 IMPLANT
DURASEAL SPINE SEALANT 3ML (MISCELLANEOUS) IMPLANT
ELECT BLADE TIP CTD 4 INCH (ELECTRODE) IMPLANT
ELECT REM PT RETURN 9FT ADLT (ELECTROSURGICAL) ×2
ELECTRODE REM PT RTRN 9FT ADLT (ELECTROSURGICAL) ×1 IMPLANT
GLOVE BIOGEL PI IND STRL 7.5 (GLOVE) ×1 IMPLANT
GLOVE BIOGEL PI INDICATOR 7.5 (GLOVE) ×1
GLOVE SURG SS PI 7.5 STRL IVOR (GLOVE) ×2 IMPLANT
GLOVE SURG SS PI 8.0 STRL IVOR (GLOVE) ×4 IMPLANT
GOWN STRL REUS W/TWL XL LVL3 (GOWN DISPOSABLE) ×4 IMPLANT
IV CATH 14GX2 1/4 (CATHETERS) IMPLANT
KIT BASIN OR (CUSTOM PROCEDURE TRAY) ×2 IMPLANT
KIT POSITIONING SURG ANDREWS (MISCELLANEOUS) ×2 IMPLANT
MANIFOLD NEPTUNE II (INSTRUMENTS) ×2 IMPLANT
NEEDLE SPNL 18GX3.5 QUINCKE PK (NEEDLE) ×4 IMPLANT
PACK LAMINECTOMY ORTHO (CUSTOM PROCEDURE TRAY) ×2 IMPLANT
PATTIES SURGICAL .5 X.5 (GAUZE/BANDAGES/DRESSINGS) IMPLANT
PATTIES SURGICAL .75X.75 (GAUZE/BANDAGES/DRESSINGS) IMPLANT
PATTIES SURGICAL 1X1 (DISPOSABLE) IMPLANT
SPONGE SURGIFOAM ABS GEL 100 (HEMOSTASIS) ×2 IMPLANT
STAPLER VISISTAT (STAPLE) IMPLANT
STRIP CLOSURE SKIN 1/2X4 (GAUZE/BANDAGES/DRESSINGS) ×2 IMPLANT
SUT NURALON 4 0 TR CR/8 (SUTURE) IMPLANT
SUT PROLENE 3 0 PS 2 (SUTURE) ×2 IMPLANT
SUT VIC AB 1 CT1 27 (SUTURE) ×1
SUT VIC AB 1 CT1 27XBRD ANTBC (SUTURE) ×1 IMPLANT
SUT VIC AB 1-0 CT2 27 (SUTURE) ×4 IMPLANT
SUT VIC AB 2-0 CT1 27 (SUTURE)
SUT VIC AB 2-0 CT1 TAPERPNT 27 (SUTURE) IMPLANT
SUT VIC AB 2-0 CT2 27 (SUTURE) ×4 IMPLANT
SYR 3ML LL SCALE MARK (SYRINGE) IMPLANT
TOWEL OR 17X26 10 PK STRL BLUE (TOWEL DISPOSABLE) ×2 IMPLANT
TOWEL OR NON WOVEN STRL DISP B (DISPOSABLE) IMPLANT
YANKAUER SUCT BULB TIP NO VENT (SUCTIONS) IMPLANT

## 2014-02-23 NOTE — Interval H&P Note (Signed)
History and Physical Interval Note:  02/23/2014 7:27 AM  Jorge Ferguson  has presented today for surgery, with the diagnosis of HNP L4 - L5  The various methods of treatment have been discussed with the patient and family. After consideration of risks, benefits and other options for treatment, the patient has consented to  Procedure(s): MICRO LUMBAR DECOMPRESSION L4 - L5 ON THE RIGHT  1 LEVEL (Right) as a surgical intervention .  The patient's history has been reviewed, patient examined, no change in status, stable for surgery.  I have reviewed the patient's chart and labs.  Questions were answered to the patient's satisfaction.     Willis Holquin C

## 2014-02-23 NOTE — H&P (View-Only) (Signed)
Jorge Ferguson is an 23 y.o. male.   Chief Complaint: back and right leg pain, numbness HPI: The patient is a 23 year old male who presents with back pain. The patient is here today in referral from [name] with [facility]. The patient reports low back symptoms which began 3 month(s) ago in association with an established activity (bending over the sink washing his face). Symptoms are reported to be located in the low back and Symptoms include numbness. The pain radiates to the right lower leg. The patient feels as if the symptoms are improving. Symptoms are exacerbated by recumbency (stretching the leg out). Prior to being seen today the patient was previously evaluated in urgent care. Past evaluation has included MRI of the lumbar spine. Past treatment has included nonsteroidal anti-inflammatory drugs, non-opioid analgesics, opioid analgesics and muscle relaxants.  He goes by Jorge Ferguson. Chief complaint is numbness in the right medial calf, weak leg, pain in the leg. This is a pleasant 23 year old male who is kindly referred here by [name], [facility] for evaluation of the above mentioned symptoms. The patient reports three months ago he bent over to wash his face in the sink and had acute back pain and leg pain and numbness. He was seen by [name] and placed on a Prednisone Dosepak and he reports he has had some numbness into the right leg, weakness in his quad. He is unable to go up and down stairs or squat down. He is having significant symptoms associated with that. He underwent an MRI due to persistent symptoms. This was [facility] at [facility]. It took some time as they were filled. Disc herniation small paracentral to the right foramen with mass effect on the right L5 nerve root and abutting the right foraminal L4 root. This was on 01/19/14. He was kindly referred here for persistent pain predominantly and numbness into the leg.  Review of systems is negative for fevers,  chest pain, shortness of breath, unexplained recent weight loss, loss of bowel or bladder function, burning with urination, joint swelling, rashes, weakness or numbness, difficulty with balance, easy bruising, excessive thirst or frequent urination.  Patient's pain drawing is organic.  No past medical history on file.  Past Surgical History  Procedure Laterality Date  . Leg surgery      Family History  Problem Relation Age of Onset  . Diabetes Mother   . Hypertension Father    Social History:  reports that he has been smoking.  He does not have any smokeless tobacco history on file. He reports that he does not drink alcohol or use illicit drugs.  Allergies:  Allergies  Allergen Reactions  . Azithromycin Hives  . Hydrocodone Bitart (Antituss) [Hydrocodone] Rash    Pt had reaction to Hycodan syrup- rash. Pt reports he is able to take vicodin without any sign of allergy     (Not in a hospital admission)  No results found for this or any previous visit (from the past 48 hour(s)). No results found.  Review of Systems  Constitutional: Negative.   HENT: Negative.   Eyes: Negative.   Respiratory: Negative.   Cardiovascular: Negative.   Gastrointestinal: Negative.   Genitourinary: Negative.   Musculoskeletal: Positive for back pain.  Skin: Negative.   Neurological: Positive for sensory change and focal weakness.  Psychiatric/Behavioral: Negative.     There were no vitals taken for this visit. Physical Exam  Constitutional: He is oriented to person, place, and time.  He appears well-developed and well-nourished.  HENT:  Head: Normocephalic and atraumatic.  Eyes: Conjunctivae and EOM are normal. Pupils are equal, round, and reactive to light.  Neck: Normal range of motion. Neck supple.  Cardiovascular: Normal rate and regular rhythm.   Respiratory: Effort normal and breath sounds normal.  GI: Soft. Bowel sounds are normal.  Musculoskeletal:  On exam he has decreased  sensation in the L4 dermatome on the right. He has near absent knee reflex on the right. He has quad atrophy, quad weakness 4+/5 on the right compared to the left. Trace EHL weakness is noted on the right compared to the left. Straight leg raise produces buttock, thigh and calf pain.  Neurological: He is alert and oriented to person, place, and time. He has normal reflexes.  Skin: Skin is warm and dry.  Psychiatric: He has a normal mood and affect.    Three view x-rays AP and lateral flexion and extension demonstrate disc space narrowing at L4-5, no instability on flexion and extension. Hips are unremarkable. Outside MRI demonstrates disc herniation at L4-5 paracentral to the right with what appears to be a significant herniation into the foramen affecting the 4 root.  Assessment/Plan HNP L4-5 right L4 radiculopathy with myotomal weakness, dermatomal dysesthesias, disc herniation at L4-5 foraminal. Significant mass effect on the 4 root into the foramen.  Discussed with the radiologist, Dr. Gennette Pachris Mattern for an additional opinion concerning the disc herniation that appears larger than initially interpreted especially with his clinical evidence of an L4 nerve root deficit. He confirmed early significant neurocompression, hemorrhage or disc extending into the foramen. We discussed options at this point in time due to three months of symptoms. He does have ongoing radicular pain. He does have significant dermatomal decreased sensation and myotomal weakness. It would be appropriate at this point to consider decompression of the 4 root and also the 5 root. We discussed the possibility he may receive no benefit from the decompression given the duration of his neurocompression. He understands that. He also has no back pain, no previous problems. He may require a fusion in the future if necessary. Discussed time out of work. He sits most of the time, he is a Agricultural engineermanicurist. He has had a history of trauma, no DVT, PE.  Proceed as soon as possible. He has mild disc herniation at L5-S1 to the left. He has failed conservative treatment at this point. We will proceed accordingly. I appreciate the kind referral.  I had an extensive discussion of the risks and benefits of the lumbar decompression with the patient including bleeding, infection, damage to neurovascular structures, epidural fibrosis, CSF leak requiring repair. We also discussed increase in pain, adjacent segment disease, recurrent disc herniation, need for future surgery including repeat decompression and/or fusion. We also discussed risks of postoperative hematoma, paralysis, anesthetic complications including DVT, PE, death, cardiopulmonary dysfunction. In addition, the perioperative and postoperative courses were discussed in detail including the rehabilitative time and return to functional activity and work. I provided the patient with an illustrated handout and utilized the appropriate surgical models.  Plan microlumbar decompression L4-5 right  BISSELL, JACLYN M. PA-C for Dr. Shelle IronBeane 02/12/2014, 6:22 PM

## 2014-02-23 NOTE — Transfer of Care (Signed)
Immediate Anesthesia Transfer of Care Note  Patient: Jorge Ferguson  Procedure(s) Performed: Procedure(s): MICRO LUMBAR DECOMPRESSION L4 - L5 ON THE RIGHT  1 LEVEL (Right)  Patient Location: PACU  Anesthesia Type:General  Level of Consciousness: awake, alert , oriented and patient cooperative  Airway & Oxygen Therapy: Patient Spontanous Breathing and Patient connected to face mask oxygen  Post-op Assessment: Report given to PACU RN, Post -op Vital signs reviewed and stable and Patient moving all extremities X 4  Post vital signs: stable  Complications: No apparent anesthesia complications

## 2014-02-23 NOTE — Anesthesia Postprocedure Evaluation (Signed)
  Anesthesia Post-op Note  Patient: Jorge Ferguson  Procedure(s) Performed: Procedure(s) (LRB): MICRO LUMBAR DECOMPRESSION L4 - L5 ON THE RIGHT  1 LEVEL (Right)  Patient Location: PACU  Anesthesia Type: General  Level of Consciousness: awake and alert   Airway and Oxygen Therapy: Patient Spontanous Breathing  Post-op Pain: mild  Post-op Assessment: Post-op Vital signs reviewed, Patient's Cardiovascular Status Stable, Respiratory Function Stable, Patent Airway and No signs of Nausea or vomiting  Last Vitals:  Filed Vitals:   02/23/14 1453  BP: 116/57  Pulse: 60  Temp: 36.6 C  Resp: 14    Post-op Vital Signs: stable   Complications: No apparent anesthesia complications

## 2014-02-23 NOTE — Anesthesia Preprocedure Evaluation (Addendum)
Anesthesia Evaluation  Patient identified by MRN, date of birth, ID band Patient awake    Reviewed: Allergy & Precautions, H&P , NPO status , Patient's Chart, lab work & pertinent test results  Airway Mallampati: II  TM Distance: >3 FB Neck ROM: Full    Dental no notable dental hx.    Pulmonary neg pulmonary ROS, Current Smoker,  breath sounds clear to auscultation  Pulmonary exam normal       Cardiovascular negative cardio ROS  IRhythm:Regular Rate:Normal     Neuro/Psych negative neurological ROS  negative psych ROS   GI/Hepatic negative GI ROS, Neg liver ROS,   Endo/Other  negative endocrine ROS  Renal/GU negative Renal ROS  negative genitourinary   Musculoskeletal negative musculoskeletal ROS (+)   Abdominal   Peds negative pediatric ROS (+)  Hematology negative hematology ROS (+)   Anesthesia Other Findings   Reproductive/Obstetrics negative OB ROS                          Anesthesia Physical Anesthesia Plan  ASA: I  Anesthesia Plan: General   Post-op Pain Management:    Induction: Intravenous  Airway Management Planned: Oral ETT  Additional Equipment:   Intra-op Plan:   Post-operative Plan: Extubation in OR  Informed Consent: I have reviewed the patients History and Physical, chart, labs and discussed the procedure including the risks, benefits and alternatives for the proposed anesthesia with the patient or authorized representative who has indicated his/her understanding and acceptance.   Dental advisory given  Plan Discussed with: CRNA  Anesthesia Plan Comments:        Anesthesia Quick Evaluation

## 2014-02-23 NOTE — Op Note (Signed)
NAMStanton Kidney:  Vogt, Jonahtan                  ACCOUNT NO.:  [id number]  MEDICAL RECORD NO.:  [id number]  LOCATION:  [id number]                         [facility]  PHYSICIAN:  Jene EveryJeffrey Starsky Nanna, M.D.    DATE OF BIRTH:  1990/10/06  DATE OF PROCEDURE:  02/23/2014 DATE OF DISCHARGE:                              OPERATIVE REPORT   PREOPERATIVE DIAGNOSIS:  Herniated nucleus pulposus, spinal stenosis, L4- 5 right.  POSTOPERATIVE DIAGNOSIS:  Herniated nucleus pulposus, spinal stenosis, L4-5 right.  PROCEDURE PERFORMED: 1. Microlumbar decompression, L4-5 right with foraminotomies, L5 and     L4. 2. Microdiskectomy L4-5, right.  ANESTHESIA:  General.  ASSISTANT:  Lanna PocheJacqueline Bissell, PA  HISTORY:  A 23 year old, who has had advanced L4-L5 radiculopathy secondary to HNP extending into the foramen.  He had myotomal weakness, dermatome with dysesthesias.  He was indicated for lumbar decompression, decompression at 4-5 roots.  Risks and benefits were discussed including bleeding, infection, damage to neurovascular structures, no change in symptoms, worsening symptoms, DVT, PE, anesthetic complications, persistent neurologic deficit, etc.  TECHNIQUE:  With the patient in supine position, after induction of adequate general anesthesia, 25 2 g Kefzol, placed prone on the [place] frame.  All bony prominences were well padded.  Lumbar region was prepped and draped in usual sterile fashion.  Two 18-gauge spinal needles were utilized to localize L4-5 interspace, confirmed with x-ray. Incision was made from the spinous process 4-5.  Subcutaneous tissue was dissected.  Electrocautery was utilized to achieve hemostasis. Dorsolumbar fascia was identified and divided in line with skin incision.  Paraspinous muscle elevated from lamina 4 and 5.  McCullough retractors placed.  Operating microscope was draped and brought on the surgical field after confirmation of the level of 4-5.  He had a small interlaminar window  and shingling was occurring at 4-5.  Did have disk degeneration there noted.  Hemilaminotomy of the caudad edge of L4 was performed with 2 and 3 mm Kerrison preserving the pars, detaching the cephalad portion of the ligamentum flavum.  Straight curette was utilized to detach the ligamentum flavum from the cephalad edge of 5. Foraminotomy of 5 was performed then after neuro patty placed beneath the ligamentum flavum.  Generous foraminotomy of 4-5 was performed and we removed the ligamentum flavum from the interspace.  I then decompressed lateral recess to the medial border of the pedicle. Hypertrophic venous plexus was noted and bipolar electrocautery was utilized to achieve hemostasis.  Gently we mobilized the fiber in the thecal sac medially and found the foraminal disk, the disk extending posterolaterally into the foramen to confirm 18-gauge needle in the disk space by x-ray.  We then took a Woodson retractor and maneuver that beneath the thecal sac above the disk space at 4 and through the foramen of 4, a very small perforated fragment that was retrieved.  Then, performed an annulotomy.  Copious portion of the disk material was removed from the disk space with a micropituitary and a regular pituitary from both sides on the operating room table.  We further mobilized the disk herniation with a nerve hook, just beneath the anulus and out in the foramen.  Full diskectomy of herniated material was  performed.  Following that, a Woodson probe was passed freely up the foramen of 4 and 5.  Foraminotomy of 4 was performed as well.  With a 2- mm Kerrison from the opposite side of the operating room table utilizing just on the bony surfaces.  The neural elements were protected at all times.  Bipolar electrocautery was utilized to achieve hemostasis. Copiously irrigated disk space and antibiotic irrigation.  Inspection revealed no evidence of CSF leakage or active bleeding.  Again, we probed to  above the pedicle of 4 and below the pedicle 5 beneath thecal sac, the axilla of the root of 5, the shoulder of the root and out into the foramen, no residual disk herniated material noted.  Placed thrombin- soaked Gelfoam in the laminotomy defect.  We removed the [facility] retractor, irrigated the paraspinous musculature, repaired the dorsolumbar fascia with 1 Vicryl, subcu with 2-0, and skin with 4-0 subcuticular Prolene.  Sterile dressing was applied.  Placed supine on hospital bed, extubated without difficulty, and transported to the recovery room in satisfactory condition.  The patient tolerated the procedure well.  No complications.  Assistant, Lanna PocheJacqueline Bissell, [place].  Minimal blood loss.     Jene EveryJeffrey Jessikah Dicker, M.D.     Cordelia PenJB/MEDQ  D:  02/23/2014  T:  02/23/2014  Job:  [id number]

## 2014-02-23 NOTE — Discharge Instructions (Signed)
Walk As Tolerated utilizing back precautions.  No bending, twisting, or lifting.  No driving for 2 weeks.   °Aquacel dressing may remain in place until follow up. May shower with aquacel dressing in place. If the dressing peels off or becomes saturated, you may remove aquacel dressing and place gauze and tape dressing which should be kept clean and dry and changed daily. Do not remove steri-strips if they are present. °See Dr. Sipriano Fendley in office in 10 to 14 days. Begin taking aspirin 81mg per day starting 4 days after your surgery if not allergic to aspirin or on another blood thinner. °Walk daily even outside. Use a cane or walker only if necessary. °Avoid sitting on soft sofas. ° °

## 2014-02-23 NOTE — Brief Op Note (Signed)
02/23/2014  1:01 PM  PATIENT:  Jorge Ferguson  23 y.o. male  PRE-OPERATIVE DIAGNOSIS:  HNP L4 - L5  POST-OPERATIVE DIAGNOSIS:  HNP L4 - L5  PROCEDURE:  Procedure(s): MICRO LUMBAR DECOMPRESSION L4 - L5 ON THE RIGHT  1 LEVEL (Right)  SURGEON:  Surgeon(s) and Role:    * Javier DockerJeffrey C Jacorion Klem, MD - Primary  PHYSICIAN ASSISTANT:   ASSISTANTS: Bissell   ANESTHESIA:   general  EBL:  Total I/O In: 1000 [I.V.:1000] Out: 100 [Blood:100]  BLOOD ADMINISTERED:none  DRAINS: none   LOCAL MEDICATIONS USED:  MARCAINE     SPECIMEN:  Source of Specimen:  L45  DISPOSITION OF SPECIMEN:  PATHOLOGY  COUNTS:  YES  TOURNIQUET:  * No tourniquets in log *  DICTATION: .Other Dictation: Dictation Number [id number]  PLAN OF CARE: Admit for overnight observation  PATIENT DISPOSITION:  PACU - hemodynamically stable.   Delay start of Pharmacological VTE agent (>24hrs) due to surgical blood loss or risk of bleeding: yes

## 2014-02-24 ENCOUNTER — Encounter (HOSPITAL_COMMUNITY): Payer: Self-pay | Admitting: Specialist

## 2014-02-24 DIAGNOSIS — M5116 Intervertebral disc disorders with radiculopathy, lumbar region: Secondary | ICD-10-CM | POA: Diagnosis not present

## 2014-02-24 NOTE — Evaluation (Signed)
Occupational Therapy Evaluation Patient Details Name: Jorge Ferguson MRN: [id number] DOB: 02/12/1991 Today's Date: 02/24/2014    History of Present Illness Back surgery per Dr Shelle IronBeane ( L4- L5 discetomy)   Clinical Impression   education complete s/p back surgery    Follow Up Recommendations  No OT follow up    Equipment Recommendations  None recommended by OT       Precautions / Restrictions Precautions Precautions: Back Restrictions Weight Bearing Restrictions: No      Mobility Bed Mobility Overal bed mobility: Independent             General bed mobility comments: with verbal cues  Transfers Overall transfer level: Independent               General transfer comment: with verbal cues for folllowing back precautions         ADL Overall ADL's : Needs assistance/impaired                                 Tub/ Shower Transfer: 3 in 1     General ADL Comments: Pt overall S - min A with ADL activity.  All education complete with ADL activity .  Pt able to perform ADL activity with S following back precautions.                Pertinent Vitals/Pain Pain Assessment: 0-10 Pain Location: back Pain Descriptors / Indicators: Sore     Hand Dominance     Extremity/Trunk Assessment Upper Extremity Assessment Upper Extremity Assessment: Overall WFL for tasks assessed           Communication Communication Communication: No difficulties   Cognition Arousal/Alertness: Awake/alert Behavior During Therapy: WFL for tasks assessed/performed Overall Cognitive Status: Within Functional Limits for tasks assessed                                Home Living Family/patient expects to be discharged to:: Private residence Living Arrangements: Parent Available Help at Discharge: Family Type of Home: House             Bathroom Shower/Tub: Engineer, civil (consulting)Tub/shower unit   Bathroom Toilet: Standard     Home Equipment: None          Prior  Functioning/Environment Level of Independence: Independent                      OT Goals(Current goals can be found in the care plan section) Acute Rehab OT Goals 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 [employer] OT Goal Formulation: With patient Potential to Achieve Goals: Good  OT Frequency:                End of Session    Activity Tolerance: Patient tolerated treatment well Patient left: in chair   Time: 1010-1034 OT Time Calculation (min): 24 min Charges:  OT General Charges $OT Visit: 1 Procedure OT Evaluation $Initial OT Evaluation Tier I: 1 Procedure OT Treatments $Self Care/Home Management : 8-22 mins G-Codes: OT G-codes **NOT FOR INPATIENT CLASS** Functional Assessment Tool Used: clinical observation Functional Limitation: Self care Self Care Current Status (Y4034(G8987): At least 1 percent but less than 20 percent impaired, limited or restricted Self Care Goal Status (V4259(G8988): 0 percent impaired, limited or restricted Self Care Discharge Status [phone](G8989): At least 1 percent but less than  20 percent impaired, limited or restricted  Jorge Ferguson, Jorge Ferguson 02/24/2014, 10:45 AM

## 2014-02-24 NOTE — Progress Notes (Signed)
Subjective: 1 Day Post-Op Procedure(s) (LRB): MICRO LUMBAR DECOMPRESSION L4 - L5 ON THE RIGHT  1 LEVEL (Right) Patient reports pain as 2 on 0-10 scale.    Objective: Vital signs in last 24 hours: Temp:  [97.5 F (36.4 C)-98.7 F (37.1 C)] 98.7 F (37.1 C) (12/31 0610) Pulse Rate:  [53-68] 57 (12/31 0610) Resp:  [8-16] 16 (12/31 0610) BP: (105-131)/(49-72) 118/49 mmHg (12/31 0610) SpO2:  [98 %-100 %] 98 % (12/31 0610) Weight:  [92.534 kg (204 lb)] 92.534 kg (204 lb) (12/30 1453)  Intake/Output from previous day: 12/30 0701 - 12/31 0700 In: 2743.3 [P.O.:360; I.V.:2328.3; IV Piggyback:55] Out: 2350 [Urine:2250; Blood:100] Intake/Output this shift:    No results for input(s): HGB in the last 72 hours. No results for input(s): WBC, RBC, HCT, PLT in the last 72 hours. No results for input(s): NA, K, CL, CO2, BUN, CREATININE, GLUCOSE, CALCIUM in the last 72 hours. No results for input(s): LABPT, INR in the last 72 hours.  Neurologically intact Neurovascular intact Intact pulses distally Compartment soft Improved sensation l4 Assessment/Plan: 1 Day Post-Op Procedure(s) (LRB): MICRO LUMBAR DECOMPRESSION L4 - L5 ON THE RIGHT  1 LEVEL (Right) Advance diet Up with therapy D/C IV fluids Discharge home with home health  Cordelro Gautreau C 02/24/2014, 10:06 AM

## 2014-02-24 NOTE — Progress Notes (Signed)
CSW consulted for SNF placement. PN reviewed. PT is not recommending follow up therapy. Pt is planning to d/c home today. CSW signing off.  Cori RazorJamie Mathieu Schloemer LCSW [phone]

## 2014-02-24 NOTE — Discharge Summary (Signed)
Physician Discharge Summary   Patient ID: Jorge Ferguson MRN: [id number] DOB/AGE: 19-Oct-1990 23 y.o.  Admit date: 02/23/2014 Discharge date: 02/24/2014  Primary Diagnosis:   HNP L4 - L5  Admission Diagnoses:  Past Medical History  Diagnosis Date  . HNP (herniated nucleus pulposus), lumbar    Discharge Diagnoses:   Active Problems:   HNP (herniated nucleus pulposus), lumbar  Procedure:  Procedure(s) (LRB): MICRO LUMBAR DECOMPRESSION L4 - L5 ON THE RIGHT  1 LEVEL (Right)   Consults: None  HPI:  see H&P    Laboratory Data: Hospital Outpatient Visit on 02/16/2014  Component Date Value Ref Range Status  . MRSA, PCR 02/16/2014 NEGATIVE  NEGATIVE Final  . Staphylococcus aureus 02/16/2014 POSITIVE* NEGATIVE Final   Comment:        The Xpert SA Assay (FDA approved for NASAL specimens in patients over 62 years of age), is one component of a comprehensive surveillance program.  Test performance has been validated by [employer] for patients greater than or equal to 61 year old. It is not intended to diagnose infection nor to guide or monitor treatment.   . Sodium 02/16/2014 140  135 - 145 mmol/L Final   Please note change in reference range.  . Potassium 02/16/2014 5.3* 3.5 - 5.1 mmol/L Final   Please note change in reference range.  . Chloride 02/16/2014 105  96 - 112 mEq/L Final  . CO2 02/16/2014 30  19 - 32 mmol/L Final  . Glucose, Bld 02/16/2014 97  70 - 99 mg/dL Final  . BUN 02/16/2014 13  6 - 23 mg/dL Final  . Creatinine, Ser 02/16/2014 0.62  0.50 - 1.35 mg/dL Final  . Calcium 02/16/2014 9.9  8.4 - 10.5 mg/dL Final  . GFR calc non Af Amer 02/16/2014 >90  >90 mL/min Final  . GFR calc Af Amer 02/16/2014 >90  >90 mL/min Final   Comment: (NOTE) The eGFR has been calculated using the CKD EPI equation. This calculation has not been validated in all clinical situations. eGFR's persistently <90 mL/min signify possible Chronic Kidney Disease.   . Anion gap 02/16/2014  5  5 - 15 Final  . WBC 02/16/2014 6.2  4.0 - 10.5 K/uL Final  . RBC 02/16/2014 5.17  4.22 - 5.81 MIL/uL Final  . Hemoglobin 02/16/2014 15.0  13.0 - 17.0 g/dL Final  . HCT 02/16/2014 45.5  39.0 - 52.0 % Final  . MCV 02/16/2014 88.0  78.0 - 100.0 fL Final  . MCH 02/16/2014 29.0  26.0 - 34.0 pg Final  . MCHC 02/16/2014 33.0  30.0 - 36.0 g/dL Final  . RDW 02/16/2014 12.2  11.5 - 15.5 % Final  . Platelets 02/16/2014 206  150 - 400 K/uL Final   No results for input(s): HGB in the last 72 hours. No results for input(s): WBC, RBC, HCT, PLT in the last 72 hours. No results for input(s): NA, K, CL, CO2, BUN, CREATININE, GLUCOSE, CALCIUM in the last 72 hours. No results for input(s): LABPT, INR in the last 72 hours.  X-Rays:Dg Lumbar Spine 2-3 Views  02/16/2014   CLINICAL DATA:  23 year old male with lower back pain. Preoperative exam for L4-5 decompression. Subsequent encounter.  EXAM: LUMBAR SPINE - 2-3 VIEW  COMPARISON:  01/19/2014 MR.  FINDINGS: Level assignment as per recent MR with last fully open disc space labeled the L5-S1 level. Iliac crests cross at the L4-5 disc space level. Films have been labeled.  Minimal L4-5 and mild L5-S1 disc space narrowing.  Congenital  mild narrowing of the lumbar spine.  IMPRESSION: Minimal L4-5 and mild L5-S1 disc space narrowing.  Please see above.   Electronically Signed   By: Chauncey Cruel M.D.   On: 02/16/2014 12:14   Dg Spine Portable 1 View  02/23/2014   CLINICAL DATA:  Herniated nucleus pulposis  EXAM: PORTABLE SPINE - 1 VIEW  COMPARISON:  None.  FINDINGS: Cross-table lateral lumbar image is labeled image 3. Metallic probe tip overlies the posterior aspect of the L4-5 discs level. There is disc narrowing at L4-5 and L5-S1. No fracture or spondylolisthesis.  IMPRESSION: Metallic probe overlies the posterior aspect of the L4-5 disc level.   Electronically Signed   By: Lowella Grip M.D.   On: 02/23/2014 12:37   Dg Spine Portable 1 View  02/23/2014    CLINICAL DATA:  Intraoperative anatomic localization.  EXAM: PORTABLE SPINE - 1 VIEW  COMPARISON:  01/19/2014 MRI.  FINDINGS: Lateral intraoperative localization view is submitted for interpretation. Soft tissue retractors are present dorsal to the L5-S1 interspace. The superior probe is dorsal to the L5 pedicles. Inferior probe is immediately dorsal to the L5-S1 disc space. These are likely within the L4-L5 and L5-S1 interspinous spaces.  IMPRESSION: Intraoperative localization as above.   Electronically Signed   By: Dereck Ligas M.D.   On: 02/23/2014 12:06   Dg Spine Portable 1 View  02/23/2014   CLINICAL DATA:  Elective spinal surgery for lumbar disc herniation.  EXAM: PORTABLE SPINE - 1 VIEW  COMPARISON:  February 16, 2014.  FINDINGS: Single lateral intraoperative view of the lumbar spine was submitted for review. Two surgical probes are seen directed toward the posterior interspinous spaces of L4-5 and L5-S1.  IMPRESSION: Surgical localization as described above.   Electronically Signed   By: Sabino Dick M.D.   On: 02/23/2014 11:49    EKG:No orders found for this or any previous visit.   Hospital Course: Patient was admitted to [facility] and taken to the OR and underwent the above state procedure without complications.  Patient tolerated the procedure well and was later transferred to the recovery room and then to the orthopaedic floor for postoperative care.  They were given PO and IV analgesics for pain control following their surgery.  They were given 24 hours of postoperative antibiotics.   PT was consulted postop to assist with mobility and transfers.  The patient was allowed to be WBAT with therapy and was taught back precautions. Discharge planning was consulted to help with postop disposition and equipment needs.  Patient had a good night on the evening of surgery and started to get up OOB with therapy on day one. Patient was seen in rounds and was ready to go home on day one.   They were given discharge instructions and dressing directions.  They were instructed on when to follow up in the office with Dr. Tonita Cong.   Diet: Regular diet Activity:WBAT; Lspine precaution Follow-up:in 10-14 days Disposition - Home Discharged Condition: good      Medication List    TAKE these medications        docusate sodium 100 MG capsule  Commonly known as:  COLACE  Take 1 capsule (100 mg total) by mouth 2 (two) times daily as needed for mild constipation.     methocarbamol 500 MG tablet  Commonly known as:  ROBAXIN  Take 1 tablet (500 mg total) by mouth every 8 (eight) hours as needed for muscle spasms.     oxyCODONE-acetaminophen 5-325  MG per tablet  Commonly known as:  PERCOCET  Take 1 tablet by mouth every 4 (four) hours as needed.           Follow-up Information    Follow up with BEANE,JEFFREY C, MD In 2 weeks.   Specialty:  Orthopedic Surgery   Why:  For suture removal   Contact information:   [address] [place] [address] [phone]       Signed: Lacie Draft, PA-C Orthopaedic Surgery 02/24/2014, 10:08 AM

## 2014-02-24 NOTE — Care Management Note (Signed)
    Page 1 of 1   02/24/2014     12:30:41 PM CARE MANAGEMENT NOTE 02/24/2014  Patient:  Jorge Ferguson,Jorge Ferguson   Account Number:  [id number]  Date Initiated:  02/24/2014  Documentation initiated by:  [facility] HospitalJEFFRIES,Nelly Scriven  Subjective/Objective Assessment:   outpt in bed: MICRO LUMBAR DECOMPRESSION L4 - L5 ON THE RIGHT  1 LEVEL (Right)     Action/Plan:   discharge planning   Anticipated DC Date:  02/24/2014   Anticipated DC Plan:  HOME/SELF CARE      DC Planning Services  CM consult      Choice offered to / List presented to:             Status of service:  Completed, signed off Medicare Important Message given?   (If response is "NO", the following Medicare IM given date fields will be blank) Date Medicare IM given:   Medicare IM given by:   Date Additional Medicare IM given:   Additional Medicare IM given by:    Discharge Disposition:  HOME/SELF CARE  Per UR Regulation:    If discussed at Long Length of Stay Meetings, dates discussed:    Comments:  02/24/14 12:23 CM reviewed.  NO PT/OT follow up recc. NO DME recc.  NO other CM needs were communiucated.  Freddy JakschSarah Clarisa Danser, BSN, Cm [phone].

## 2014-02-24 NOTE — Evaluation (Signed)
Physical Therapy Evaluation Patient Details Name: Jorge Ferguson MRN: [id number] DOB: 03/14/1990 Today's Date: 02/24/2014   History of Present Illness  Pt is a 23 year old male s/p micro lumbar decompression L4-L5 on right.  Clinical Impression  Patient evaluated by Physical Therapy with no further acute PT needs identified. All education has been completed and the patient has no further questions. Pt mobilizing very well, reviewed back precautions at rest and during various activity, handout provided.  See below for any follow-up Physial Therapy or equipment needs. PT is signing off. Thank you for this referral.     Follow Up Recommendations No PT follow up    Equipment Recommendations  None recommended by PT    Recommendations for Other Services       Precautions / Restrictions Precautions Precautions: Back Precaution Comments: pt provided with back handout Restrictions Weight Bearing Restrictions: No      Mobility  Bed Mobility Overal bed mobility: Independent             General bed mobility comments: pt in recliner on arrival, no questions about log roll technique  Transfers Overall transfer level: Needs assistance Equipment used: None Transfers: Sit to/from Stand Sit to Stand: Supervision         General transfer comment: verbal cues for back precautions  Ambulation/Gait Ambulation/Gait assistance: Supervision;Modified independent (Device/Increase time) Ambulation Distance (Feet): 500 Feet Assistive device: None Gait Pattern/deviations: Step-through pattern Gait velocity: decr   General Gait Details: slow pace for pain control, no unsteadiness observed, reviewed back precautions during activities during ambulation  Stairs Stairs: Yes Stairs assistance: Supervision Stair Management: Alternating pattern;Step to pattern;Forwards;One rail Right Number of Stairs: 4 General stair comments: pt educated on step to pattern if symptoms arise or increased pain  however able to perform step through pattern today without difficulty  Wheelchair Mobility    Modified Rankin (Stroke Patients Only)       Balance                                             Pertinent Vitals/Pain Pain Assessment: 0-10 Pain Score:  (not rated but pt agreeable to mobilize) Pain Location: back Pain Descriptors / Indicators: Burning;Sore Pain Intervention(s): Limited activity within patient's tolerance;Monitored during session    Home Living Family/patient expects to be discharged to:: Private residence Living Arrangements: Parent Available Help at Discharge: Family Type of Home: House       Home Layout: Two level Home Equipment: None      Prior Function Level of Independence: Independent               Hand Dominance        Extremity/Trunk Assessment   Upper Extremity Assessment: Overall WFL for tasks assessed           Lower Extremity Assessment: Overall WFL for tasks assessed (denies numbess/tingling (R LE numbness presurgery per pt))      Cervical / Trunk Assessment: Normal  Communication   Communication: No difficulties  Cognition Arousal/Alertness: Awake/alert Behavior During Therapy: WFL for tasks assessed/performed Overall Cognitive Status: Within Functional Limits for tasks assessed                      General Comments      Exercises        Assessment/Plan    PT Assessment Patent does not need  any further PT services  PT Diagnosis     PT Problem List    PT Treatment Interventions     PT Goals (Current goals can be found in the Care Plan section) Acute Rehab PT Goals 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 [employer] PT Goal Formulation: All assessment and education complete, DC therapy    Frequency     Barriers to discharge        Co-evaluation               End of Session   Activity Tolerance: Patient tolerated treatment well Patient left:  (bathroom putting  in contact lenses) Nurse Communication: Mobility status         Time: 6045-40981033-1044 PT Time Calculation (min) (ACUTE ONLY): 11 min   Charges:   PT Evaluation $Initial PT Evaluation Tier I: 1 Procedure PT Treatments $Gait Training: 8-22 mins   PT G Codes:        Jorge Ferguson,Jorge Ferguson 02/24/2014, 11:10 AM Zenovia JarredKati Leara Ferguson, PT, DPT 02/24/2014 Pager: [phone]

## 2014-02-25 NOTE — Progress Notes (Signed)
G-Codes for PT evaluation    02/24/14 1110  PT Time Calculation  PT Start Time (ACUTE ONLY) 1033  PT Stop Time (ACUTE ONLY) 1044  PT Time Calculation (min) (ACUTE ONLY) 11 min  PT G-Codes **NOT FOR INPATIENT CLASS**  Functional Assessment Tool Used clinical judgement  Functional Limitation Mobility: Walking and moving around  Mobility: Walking and Moving Around Current Status (E4540) CI  Mobility: Walking and Moving Around Goal Status (J8119) CH  Mobility: Walking and Moving Around Discharge Status (J4782) [facility]  PT General Charges  $$ ACUTE PT VISIT 1 Procedure  PT Evaluation  $Initial PT Evaluation Tier I 1 Procedure  PT Treatments  $Gait Training 8-22 mins   Zenovia Jarred, PT, DPT 02/25/2014 Pager: [phone]

## 2014-12-22 NOTE — Progress Notes (Signed)
This encounter was created in error - please disregard.

## 2015-01-16 ENCOUNTER — Ambulatory Visit (INDEPENDENT_AMBULATORY_CARE_PROVIDER_SITE_OTHER): Payer: BLUE CROSS/BLUE SHIELD | Admitting: Emergency Medicine

## 2015-01-16 VITALS — BP 106/60 | HR 61 | Temp 98.4°F | Resp 16 | Ht 71.0 in | Wt 185.0 lb

## 2015-01-16 DIAGNOSIS — J029 Acute pharyngitis, unspecified: Secondary | ICD-10-CM | POA: Diagnosis not present

## 2015-01-16 LAB — POCT RAPID STREP A (OFFICE): Rapid Strep A Screen: NEGATIVE

## 2015-01-16 MED ORDER — FIRST-DUKES MOUTHWASH MT SUSP: OROMUCOSAL | Status: DC

## 2015-01-16 NOTE — Progress Notes (Addendum)
This chart was scribed for Lesle ChrisSteven Daub, MD by Andrew Auaven Small, ED Scribe. This patient was seen in room 11 and the patient's care was started at 9:07 AM.  Chief Complaint:  Chief Complaint  Patient presents with  . Sore Throat    x 3 days     HPI: Jorge Ferguson is a 24 y.o. male [facility] of a sore throat for 3 days. Pt had fever 5 days ago. States once fever started to improve he began to have sore throat as well as pain with swallowing cough and some HA. He has taken tylenol without relief. He had sick contacts at home, including his 43 y.o. son who had fever 1 week prior from him developing symptoms. His son attends day care.   Pt works as a Radio broadcast assistantnail tech.   Past Medical History  Diagnosis Date  . HNP (herniated nucleus pulposus), lumbar    Past Surgical History  Procedure Laterality Date  . Leg surgery  2008/2014    due to fracture has rod [name]/screws  . Lumbar laminectomy/decompression microdiscectomy Right 02/23/2014    Procedure: MICRO LUMBAR DECOMPRESSION L4 - L5 ON THE RIGHT  1 LEVEL;  Surgeon: Javier DockerJeffrey C Beane, MD;  Location: [facility];  Service: Orthopedics;  Laterality: Right;   Social History   Social History  . Marital Status: Single    Spouse Name: N/A  . Number of Children: N/A  . Years of Education: N/A   Social History Main Topics  . Smoking status: Current Every Day Smoker    Types: E-cigarettes  . Smokeless tobacco: None  . Alcohol Use: No  . Drug Use: No  . Sexual Activity: Yes   Other Topics Concern  . None   Social History Narrative   Family History  Problem Relation Age of Onset  . Diabetes Mother   . Hypertension Father    Allergies  Allergen Reactions  . Azithromycin Hives  . Hydrocodone Bitart (Antituss) [Hydrocodone] Rash    Pt had reaction to Hycodan syrup- rash. Pt reports he is able to take vicodin without any sign of allergy   Prior to Admission medications   Medication Sig Start Date End Date Taking? Authorizing  Arshdeep Bolger  docusate sodium (COLACE) 100 MG capsule Take 1 capsule (100 mg total) by mouth 2 (two) times daily as needed for mild constipation. Patient not taking: Reported on 01/16/2015 02/23/14   Jene EveryJeffrey Beane, MD  methocarbamol (ROBAXIN) 500 MG tablet Take 1 tablet (500 mg total) by mouth every 8 (eight) hours as needed for muscle spasms. Patient not taking: Reported on 01/16/2015 02/23/14   Jene EveryJeffrey Beane, MD  oxyCODONE-acetaminophen (PERCOCET) 5-325 MG per tablet Take 1 tablet by mouth every 4 (four) hours as needed. Patient not taking: Reported on 01/16/2015 02/23/14   Jene EveryJeffrey Beane, MD     ROS: The patient denieschills, night sweats, unintentional weight loss, chest pain, palpitations, wheezing, dyspnea on exertion, nausea, vomiting, abdominal pain, dysuria, hematuria, melena, numbness, weakness, or tingling.   All other systems have been reviewed and were otherwise negative with the exception of those mentioned in the HPI and as above.    PHYSICAL EXAM: Filed Vitals:   01/16/15 0859  BP: 106/60  Pulse: 61  Temp: 98.4 F (36.9 C)  Resp: 16   Body mass index is 25.81 kg/(m^2).   General: Alert, no acute distress HEENT:  Normocephalic, atraumatic, oropharynx patent. Blister present right side of throat. No adenopathy Eye: Everitt AmberOMI, PEERLDC Cardiovascular:  Regular rate and rhythm, no rubs murmurs or gallops.  No Carotid bruits, radial pulse intact. No pedal edema.  Respiratory: Clear to auscultation bilaterally.  No wheezes, rales, or rhonchi.  No cyanosis, no use of accessory musculature Abdominal: No organomegaly, abdomen is soft and non-tender, positive bowel sounds.  No masses. Musculoskeletal: Gait intact. No edema, tenderness Skin: No rashes. Neurologic: Facial musculature symmetric. Psychiatric: Patient acts appropriately throughout our interaction. Lymphatic: No cervical or submandibular lymphadenopathy  LABS: Results for orders placed or performed in visit on 01/16/15    POCT rapid strep A  Result Value Ref Range   Rapid Strep A Screen Negative Negative     ASSESSMENT/PLAN: Patient presents with viral pharyngitis. Throat culture to be done. He was given Duke'st mouthwash and instructed to take either Aleve or ibuprofen for inflammation and pain.I personally performed the services described in this documentation, which was scribed in my presence. The recorded information has been reviewed and is accurate.I personally performed the services described in this documentation, which was scribed in my presence. The recorded information has been reviewed and is accurate.   Gross sideeffects, risk and benefits, and alternatives of medications d/w patient. Patient is aware that all medications have potential sideeffects and we are unable to predict every sideeffect or drug-drug interaction that may occur.  By signing my name below, I, Raven Small, attest that this documentation has been prepared under the direction and in the presence of Lesle Chris, MD.  Electronically Signed: Andrew Au, ED Scribe. 01/16/2015. 9:18 AM.  Lesle Chris MD 01/16/2015 9:07 AM

## 2015-01-16 NOTE — Patient Instructions (Signed)

## 2015-01-18 LAB — CULTURE, GROUP A STREP: Organism ID, Bacteria: NORMAL

## 2015-03-17 ENCOUNTER — Encounter: Payer: Self-pay | Admitting: Family Medicine

## 2015-03-22 ENCOUNTER — Encounter: Payer: Self-pay | Admitting: Family Medicine

## 2016-02-26 DIAGNOSIS — A048 Other specified bacterial intestinal infections: Secondary | ICD-10-CM

## 2016-02-26 HISTORY — DX: Other specified bacterial intestinal infections: A04.8

## 2016-10-20 IMAGING — MR MR LUMBAR SPINE W/O CM
4 of 5 series · 18 of 48 positions shown · non-contrast
Comparison: None.

ADDENDUM:
A soft tissue mass lesion in the right L4-5 for ray min
significantly displaces the exiting right L4 nerve root. There is
severe subarticular stenosis is well. This likely represents a large
disc protrusion or free fragment.
CLINICAL DATA: Low back pain for 3 weeks, numbness, weakness

EXAM:
MRI LUMBAR SPINE WITHOUT CONTRAST
TECHNIQUE: Multiplanar, multisequence MR imaging of the lumbar spine was
performed. No intravenous contrast was administered.

[Series 3: T2 · sagittal · 4.0mm · 0.55mm/px · 5 of 13 slices shown (1 of 2)]
[im 1/13]
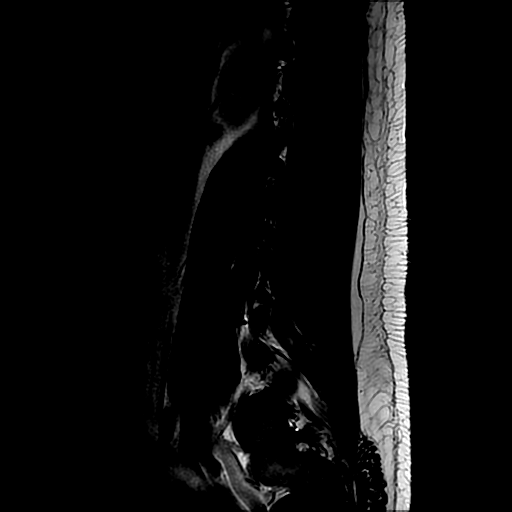
[im 4/13]
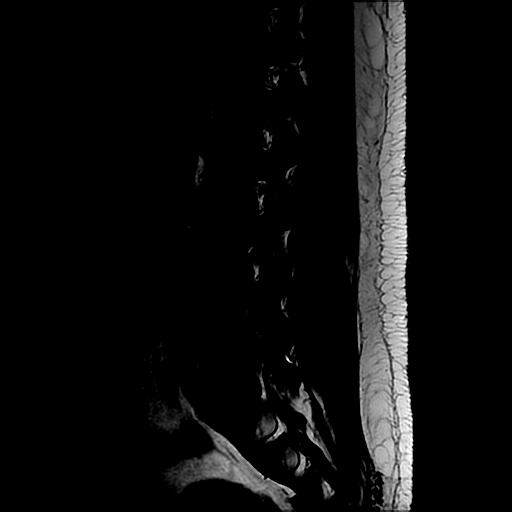
[im 7/13]
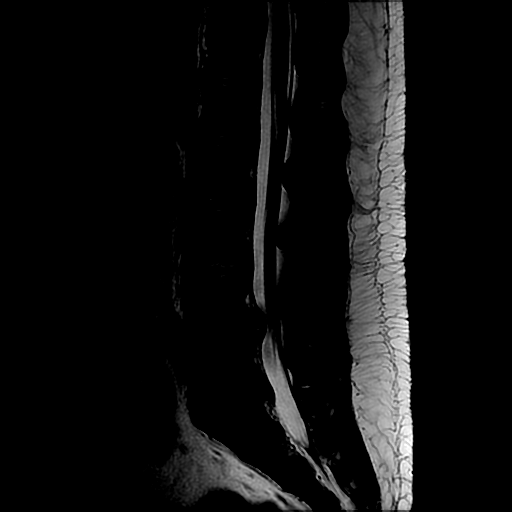
[im 10/13]
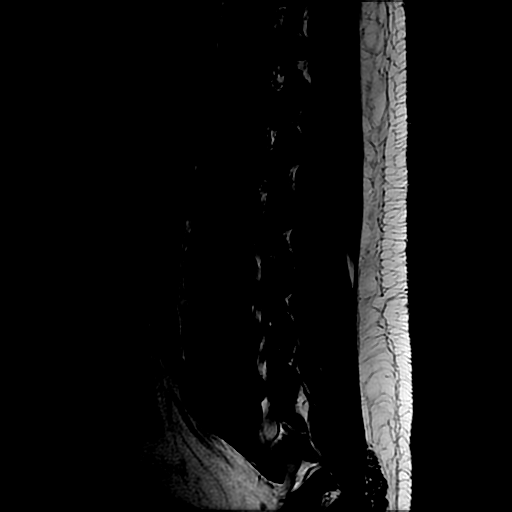
[im 13/13]
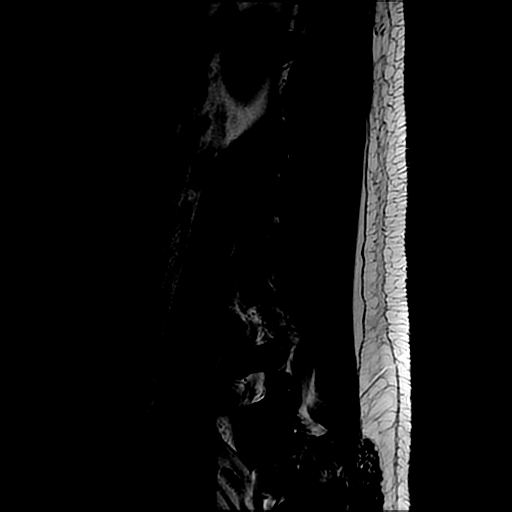

[Series 4: T1 · sagittal · 4.0mm · 0.55mm/px · 3 of 13 slices shown (1 of 2)]
[im 1/13]
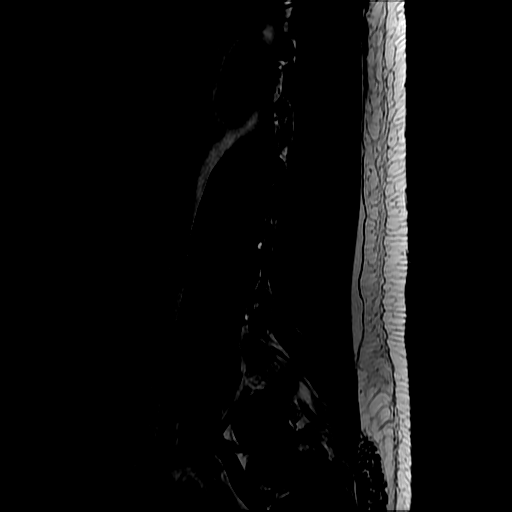
[im 7/13]
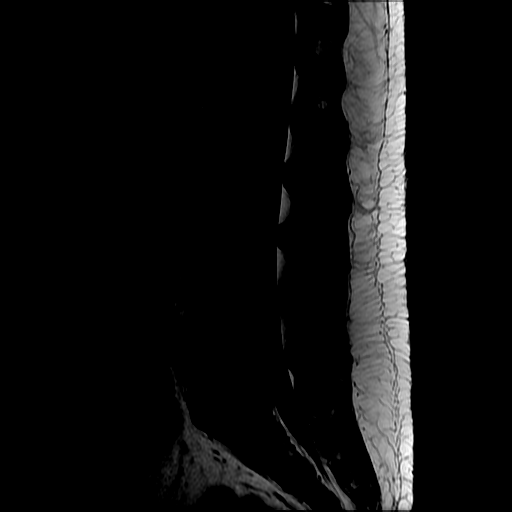
[im 13/13]
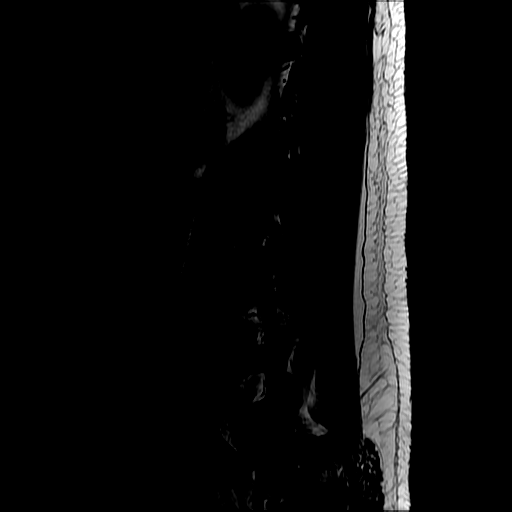

[Series 6: T2 · axial · 4.0mm · 0.39mm/px · z∈[-90,+73]mm · 7 of 36 slices shown (2 of 2)]
[im 3/36]
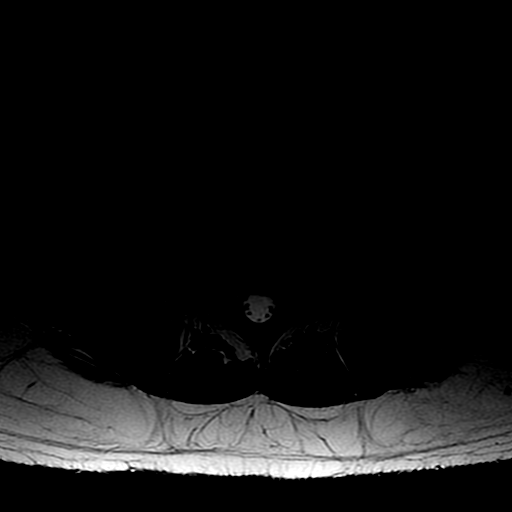
[im 5/36]
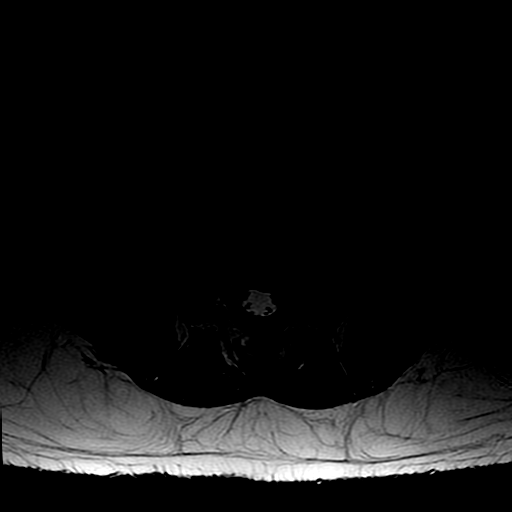
[im 8/36]
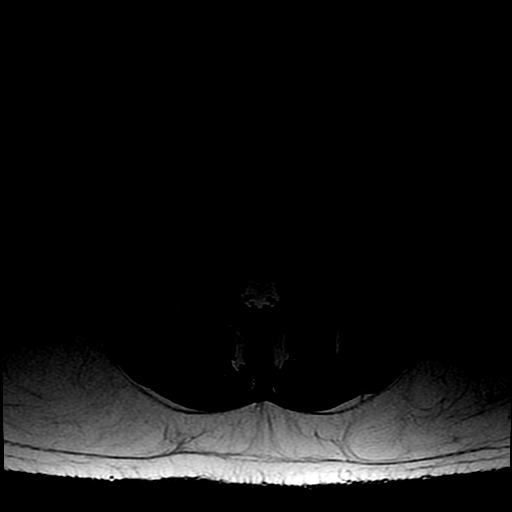
[im 12/36]
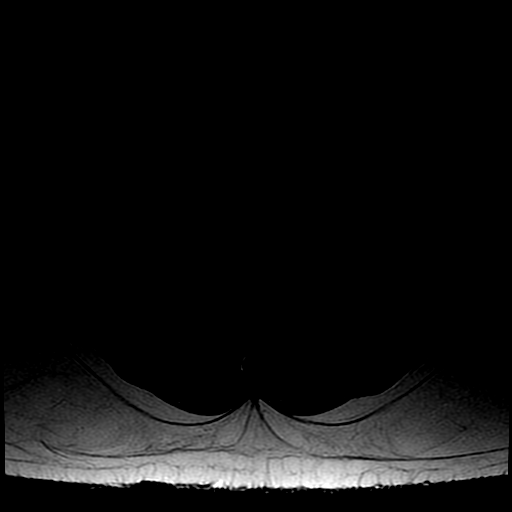
[im 17/36]
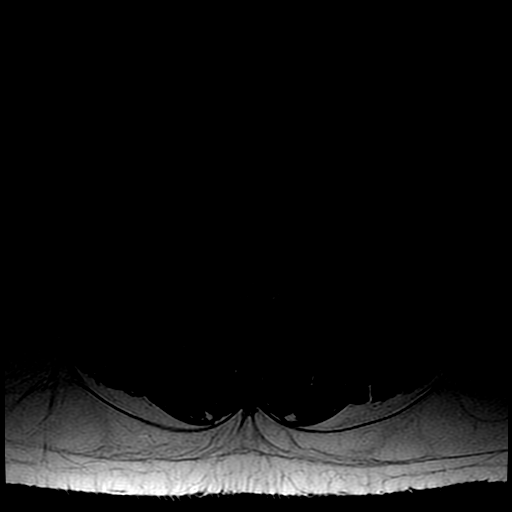
[im 19/36]
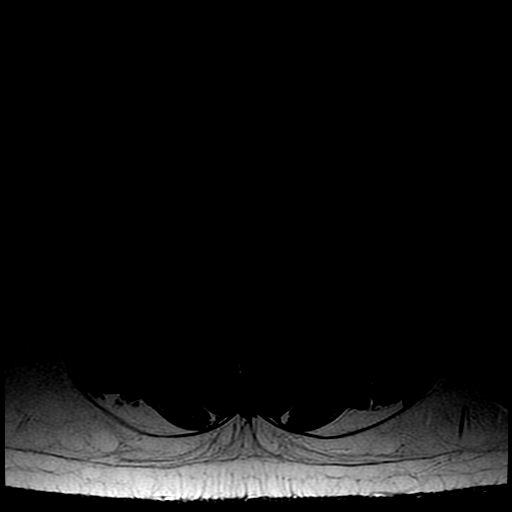
[im 31/36]
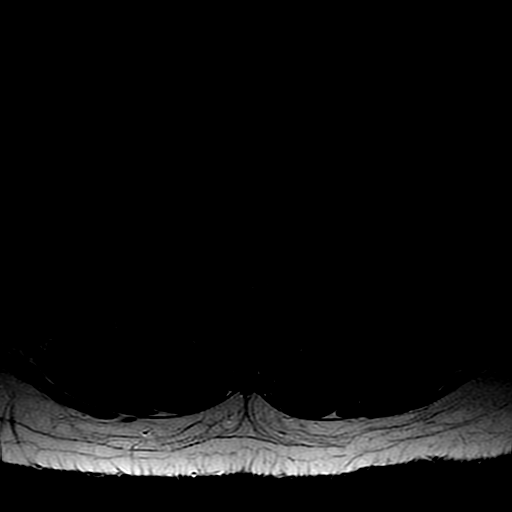

[Series 7: T1 · axial · 4.0mm · 0.39mm/px · z∈[-80,+73]mm · 3 of 36 slices shown (2 of 2)]
[im 5/36]
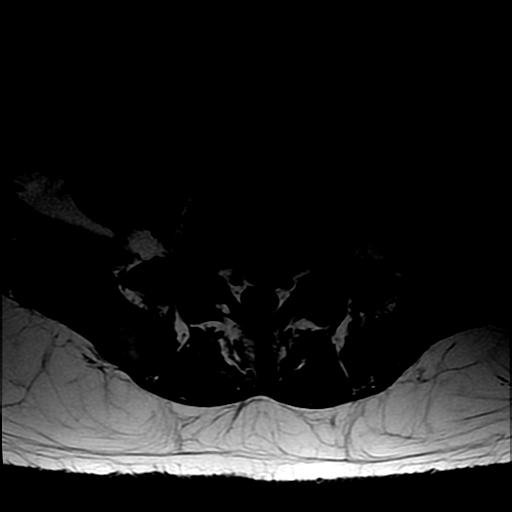
[im 19/36]
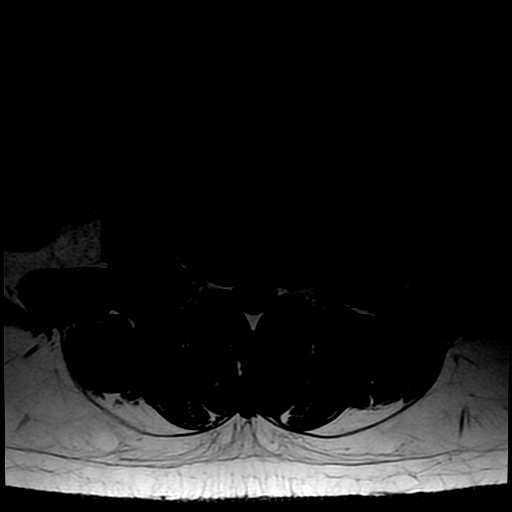
[im 31/36]
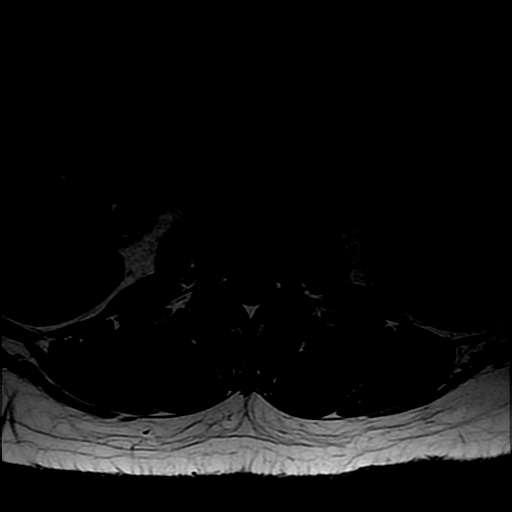

[18 of 48 positions shown; findings below may reference images not displayed]

FINDINGS: The vertebral bodies of the lumbar spine are normal in size. The
vertebral bodies of the lumbar spine are normal in alignment. There
is normal bone marrow signal demonstrated throughout the vertebra.
There is disc desiccation with mild disc height loss at L4-5 and
L5-S1.

The spinal cord is normal in signal and contour. The cord terminates
normally at T12-L1 . The nerve roots of the cauda equina and the
filum terminale are normal.

The visualized portions of the SI joints are unremarkable.

The imaged intra-abdominal contents are unremarkable.

T12-L1: No significant disc bulge. No evidence of neural foraminal
stenosis. No central canal stenosis.

L1-L2: No significant disc bulge. No evidence of neural foraminal
stenosis. No central canal stenosis.

L2-L3: No significant disc bulge. No evidence of neural foraminal
stenosis. No central canal stenosis.

L3-L4: No significant disc bulge. No evidence of neural foraminal
stenosis. No central canal stenosis.

L4-L5: Small right paracentral/ foraminal disc protrusion with mass
effect on the right intraspinal L5 nerve root and abutting the right
foraminal L4 nerve root. There is no left foraminal stenosis. No
central canal stenosis.

L5-S1: There is a left paracentral disc protrusion with mild
impression on the thecal sac. No evidence of neural foraminal
stenosis. No central canal stenosis.
IMPRESSION: 1. At L4-5 there is a small right paracentral/foraminal disc
protrusion with mass effect on the right intraspinal L5 nerve root
and abutting the right foraminal L4 nerve root.
2. At L5-S1 there is a left paracentral disc protrusion with mild
impression on the thecal sac.

## 2017-02-20 ENCOUNTER — Encounter: Payer: Self-pay | Admitting: Physician Assistant

## 2017-02-20 ENCOUNTER — Other Ambulatory Visit: Payer: Self-pay

## 2017-02-20 ENCOUNTER — Ambulatory Visit: Payer: BLUE CROSS/BLUE SHIELD | Admitting: Physician Assistant

## 2017-02-20 VITALS — BP 122/80 | HR 82 | Temp 98.4°F | Resp 18 | Ht 71.0 in | Wt 208.0 lb

## 2017-02-20 DIAGNOSIS — J0100 Acute maxillary sinusitis, unspecified: Secondary | ICD-10-CM | POA: Diagnosis not present

## 2017-02-20 DIAGNOSIS — R1013 Epigastric pain: Secondary | ICD-10-CM | POA: Diagnosis not present

## 2017-02-20 DIAGNOSIS — R05 Cough: Secondary | ICD-10-CM | POA: Diagnosis not present

## 2017-02-20 DIAGNOSIS — R059 Cough, unspecified: Secondary | ICD-10-CM

## 2017-02-20 MED ORDER — BENZONATATE 100 MG PO CAPS: 100.0000 mg | ORAL_CAPSULE | Freq: Three times a day (TID) | ORAL | 0 refills | Status: DC | PRN

## 2017-02-20 MED ORDER — OMEPRAZOLE 20 MG PO CPDR: 20.0000 mg | DELAYED_RELEASE_CAPSULE | Freq: Every day | ORAL | 0 refills | Status: DC

## 2017-02-20 MED ORDER — DOXYCYCLINE HYCLATE 100 MG PO CAPS: 100.0000 mg | ORAL_CAPSULE | Freq: Two times a day (BID) | ORAL | 0 refills | Status: DC

## 2017-02-20 NOTE — Patient Instructions (Addendum)
We are going to treat you for underlying bacterial etiology with doxycycline at this time. While taking Doxycycline:  -Do not drink milk or take iron supplements, multivitamins, calcium supplements, antacids, laxatives within 2 hours before or after taking doxycycline. -Avoid direct exposure to sunlight or tanning beds. Doxycycline can make you sunburn more easily. Wear protective clothing and use sunscreen (SPF 30 or higher) when you are outdoors. -Antibiotic medicines can cause diarrhea, which may be a sign of a new infection. If you have diarrhea that is watery or bloody, stop taking this medicine and seek medical care.   I recommend to start doing nasal saline rinses daily.  You may also use a coolmist humidifier in your room at nighttime.  For abdominal pain, this is likely gastritis from using lots of over the counter medication these past couple weeks. I recommend avoiding advil and similar medications. I have collected labs and should have those results back within one week. In the meantime, I have given you a Rx for omeprazole. This should help for any underlying heartburn or ulcer. Please take as directed. Do not take within 2 hours of taking doxycycline. I would also recommend avoiding spicy foods for the next few days. Eat a bland diet and advance diet as tolerated.   If any of your symptoms worsen or you develop new concerning symptoms like nausea, vomiting, fever, and chills seek care immediately.  Thank you for letting me participate in your health and well being.  Sinusitis, Adult Sinusitis is soreness and inflammation of your sinuses. Sinuses are hollow spaces in the bones around your face. They are located:  Around your eyes.  In the middle of your forehead.  Behind your nose.  In your cheekbones.  Your sinuses and nasal passages are lined with a stringy fluid (mucus). Mucus normally drains out of your sinuses. When your nasal tissues get inflamed or swollen, the mucus can  get trapped or blocked so air cannot flow through your sinuses. This lets bacteria, viruses, and funguses grow, and that leads to infection. Follow these instructions at home: Medicines  Take, use, or apply over-the-counter and prescription medicines only as told by your doctor. These may include nasal sprays.  If you were prescribed an antibiotic medicine, take it as told by your doctor. Do not stop taking the antibiotic even if you start to feel better. Hydrate and Humidify  Drink enough water to keep your pee (urine) clear or pale yellow.  Use a cool mist humidifier to keep the humidity level in your home above 50%.  Breathe in steam for 10-15 minutes, 3-4 times a day or as told by your doctor. You can do this in the bathroom while a hot shower is running.  Try not to spend time in cool or dry air. Rest  Rest as much as possible.  Sleep with your head raised (elevated).  Make sure to get enough sleep each night. General instructions  Put a warm, moist washcloth on your face 3-4 times a day or as told by your doctor. This will help with discomfort.  Wash your hands often with soap and water. If there is no soap and water, use hand sanitizer.  Do not smoke. Avoid being around people who are smoking (secondhand smoke).  Keep all follow-up visits as told by your doctor. This is important. Contact a doctor if:  You have a fever.  Your symptoms get worse.  Your symptoms do not get better within 10 days. Get help right  away if:  You have a very bad headache.  You cannot stop throwing up (vomiting).  You have pain or swelling around your face or eyes.  You have trouble seeing.  You feel confused.  Your neck is stiff.  You have trouble breathing. This information is not intended to replace advice given to you by your health care provider. Make sure you discuss any questions you have with your health care provider. Document Released: 07/31/2007 Document Revised:  10/08/2015 Document Reviewed: 12/07/2014 Elsevier Interactive Patient Education  2018 [employer]vier Inc.   Gastritis, Adult Gastritis is swelling (inflammation) of the stomach. When you have this condition, you can have these problems (symptoms):  Pain in your stomach.  A burning feeling in your stomach.  Feeling sick to your stomach (nauseous).  Throwing up (vomiting).  Feeling too full after you eat.  It is important to get help for this condition. Without help, your stomach can bleed, and you can get sores (ulcers) in your stomach. Follow these instructions at home:  Take over-the-counter and prescription medicines only as told by your doctor.  If you were prescribed an antibiotic medicine, take it as told by your doctor. Do not stop taking it even if you start to feel better.  Drink enough fluid to keep your pee (urine) clear or pale yellow.  Instead of eating big meals, eat small meals often. Contact a health care provider if:  Your problems get worse.  Your problems go away and then come back. Get help right away if:  You throw up blood or something that looks like coffee grounds.  You have black or dark red poop (stools).  You cannot keep fluids down.  Your stomach pain gets worse.  You have a fever.  You do not feel better after 1 week. This information is not intended to replace advice given to you by your health care provider. Make sure you discuss any questions you have with your health care provider. Document Released: 07/31/2007 Document Revised: 10/11/2015 Document Reviewed: 11/05/2014 Elsevier Interactive Patient Education  2018 [employer]   IF you received an x-ray today, you will receive an invoice from [facility]. Please contact [facility] at [phone] with questions or concerns regarding your invoice.   IF you received labwork today, you will receive an invoice from [facility]. Please contact [facility] at [phone] with  questions or concerns regarding your invoice.   Our billing staff will not be able to assist you with questions regarding bills from these companies.  You will be contacted with the lab results as soon as they are available. The fastest way to get your results is to activate your My Chart account. Instructions are located on the last page of this paperwork. If you have not heard from us regarding the results in 2 weeks, please contact this office.

## 2017-02-20 NOTE — Progress Notes (Signed)
MRN: [id number] DOB: [birth year]  Subjective:   Jorge Ferguson is a 26 y.o. male presenting for chief complaint of Cough (x2weeks) and Abdominal Pain (x2days hurts in middle on stomach and ot states it keeps making sounds ) .  Reports 2 week history of head and chest cold. Has stuffy nose, sinus congestion, sinus pressure, scratchy throat, and dry hacking cough. Notes the head cold had gotten better but then returned and feels worse.  Denies ear pain, wheezing, shortness of breath, chest tightness, chest pain and myalgia, night sweats, chills, nausea, vomiting, abdominal pain and diarrhea. Has tried sudafed, tylenol, ibuprofen, over the counter cold and sinus, dayquil, theraflu with no full relief. Has not had flu shot this year.   He also started having an intermittent epigastric pain 2 days ago. Described as burning. Pain does not radiate.Does not notice if the pain is associated to food consumption. Feels like acid reflux. Has associated heartburn and belching. Eats spicy food all the time. Avoids lying down immediately after eating.  Last normal BM was this am. Denies fever, chills, nausea, vomiting, and decreased appetite. He has taken quite a bit of advil over the past week for his cold symptoms. Has tried tums with no full relief. He is tolerating foods and liquids well. Drinks alcohol occasionally.  Smokes e-cigarettes. Denies any other aggravating or relieving factors, no other questions or concerns.  Review of Systems  Constitutional: Negative for weight loss.  Genitourinary: Negative for dysuria, flank pain, frequency, hematuria and urgency.  Neurological: Negative for dizziness.   Jorge Ferguson has a current medication list which includes the following prescription(s): first-dukes mouthwash, docusate sodium, methocarbamol, and oxycodone-acetaminophen. Also is allergic to azithromycin and hydrocodone bitart (antituss) [hydrocodone].  Jorge Ferguson  has a past medical history of HNP (herniated nucleus  pulposus), lumbar. Also  has a past surgical history that includes Leg Surgery (2008/2014) and Lumbar laminectomy/decompression microdiscectomy (Right, 02/23/2014).    Social History   Socioeconomic History  . Marital status: Single    Spouse name: Not on file  . Number of children: Not on file  . Years of education: Not on file  . Highest education level: Not on file  Social Needs  . Financial resource strain: Not on file  . Food insecurity - worry: Not on file  . Food insecurity - inability: Not on file  . Transportation needs - medical: Not on file  . Transportation needs - non-medical: Not on file  Occupational History  . Not on file  Tobacco Use  . Smoking status: Current Every Day Smoker    Types: E-cigarettes  . Smokeless tobacco: Never Used  Substance and Sexual Activity  . Alcohol use: No  . Drug use: No  . Sexual activity: Yes  Other Topics Concern  . Not on file  Social History Narrative  . Not on file    Objective:   Vitals: BP 122/80   Pulse 82   Temp 98.4 F (36.9 C) (Oral)   Resp 18   Ht '5\' 11"'  (1.803 m)   Wt 208 lb (94.3 kg)   SpO2 99%   BMI 29.01 kg/m   Physical Exam  Constitutional: He is oriented to person, place, and time. He appears well-developed and well-nourished. He does not appear ill. No distress.  HENT:  Head: Normocephalic and atraumatic.  Right Ear: External ear and ear canal normal. Tympanic membrane is not erythematous and not bulging. A middle ear effusion is present.  Left Ear: External ear and ear  canal normal. Tympanic membrane is not erythematous and not bulging. A middle ear effusion is present.  Nose: Mucosal edema and rhinorrhea present. Right sinus exhibits maxillary sinus tenderness. Right sinus exhibits no frontal sinus tenderness. Left sinus exhibits maxillary sinus tenderness. Left sinus exhibits no frontal sinus tenderness.  Mouth/Throat: Uvula is midline, oropharynx is clear and moist and mucous membranes are normal.  No tonsillar exudate.  Eyes: Conjunctivae are normal.  Neck: Normal range of motion.  Pulmonary/Chest: Effort normal and breath sounds normal. He has no wheezes. He has no rhonchi. He has no rales.  Abdominal: Soft. Normal appearance and bowel sounds are normal. He exhibits no mass. There is tenderness (mild with deep palpation) in the epigastric area. There is no rigidity, no rebound, no guarding, no tenderness at McBurney's point and negative Murphy's sign.  Lymphadenopathy:       Head (right side): No submental, no submandibular, no tonsillar, no preauricular, no posterior auricular and no occipital adenopathy present.       Head (left side): No submental, no submandibular, no tonsillar, no preauricular, no posterior auricular and no occipital adenopathy present.    He has no cervical adenopathy.       Right: No supraclavicular adenopathy present.       Left: No supraclavicular adenopathy present.  Neurological: He is alert and oriented to person, place, and time.  Skin: Skin is warm and dry.  Psychiatric: He has a normal mood and affect.  Vitals reviewed.  No results found for this or any previous visit (from the past 24 hour(s)).  Assessment and Plan :  1. Acute maxillary sinusitis, recurrence not specified Due to duration and worsening of symptoms with no improvement with consistent symptomatic treatment, will treat for underlying bacterial etiology with antibiotic this time. Encouraged to start nasal saline rinses. Return to clinic if symptoms worsen, do not improve, or as needed. - doxycycline (VIBRAMYCIN) 100 MG capsule; Take 1 capsule (100 mg total) by mouth 2 (two) times daily.  Dispense: 20 capsule; Refill: 0 2. Cough Lungs CTAB. Vitals stable. Given abx for sinusitis, which will also cover any atypical bacteria of the lungs. Pt encouraged to use tessalon perlses as needed for cough suppressant.  - benzonatate (TESSALON) 100 MG capsule; Take 1-2 capsules (100-200 mg total) by  mouth 3 (three) times daily as needed for cough.  Dispense: 40 capsule; Refill: 0  3. Abdominal pain, epigastric Pt appears well, no distress, there is mild epigastric discomfort noted on exam. Vitals stable. No other symptoms. Likely due to excess OTC NSAID medication use for his upper respiratory symptoms over the past two weeks.  DDx includes gastritis vs GERD vs PUD. Labs pending. Recommended pt avoid NSAIDs and spicy food in the meantime. Eat bland diet and advance diet as tolerated. Given strict ED/return precautions.  - CBC with Differential/Platelet - Lipase - CMP14+EGFR - H. pylori breath test - omeprazole (PRILOSEC) 20 MG capsule; Take 1 capsule (20 mg total) by mouth daily.  Dispense: 30 capsule; Refill: 0  Tenna Delaine, PA-C  [facility] 02/20/2017 4:40 PM

## 2017-02-21 LAB — CBC WITH DIFFERENTIAL/PLATELET
BASOS ABS: 0 10*3/uL (ref 0.0–0.2)
Basos: 0 %
EOS (ABSOLUTE): 0.2 10*3/uL (ref 0.0–0.4)
EOS: 2 %
HEMATOCRIT: 45.6 % (ref 37.5–51.0)
Hemoglobin: 15 g/dL (ref 13.0–17.7)
Immature Grans (Abs): 0 10*3/uL (ref 0.0–0.1)
Immature Granulocytes: 0 %
LYMPHS ABS: 1.9 10*3/uL (ref 0.7–3.1)
Lymphs: 21 %
MCH: 29.4 pg (ref 26.6–33.0)
MCHC: 32.9 g/dL (ref 31.5–35.7)
MCV: 89 fL (ref 79–97)
MONOS ABS: 0.7 10*3/uL (ref 0.1–0.9)
Monocytes: 7 %
NEUTROS ABS: 6.3 10*3/uL (ref 1.4–7.0)
Neutrophils: 70 %
Platelets: 264 10*3/uL (ref 150–379)
RBC: 5.11 x10E6/uL (ref 4.14–5.80)
RDW: 12.9 % (ref 12.3–15.4)
WBC: 9.1 10*3/uL (ref 3.4–10.8)

## 2017-02-21 LAB — CMP14+EGFR
ALK PHOS: 80 IU/L (ref 39–117)
ALT: 29 IU/L (ref 0–44)
AST: 20 IU/L (ref 0–40)
Albumin/Globulin Ratio: 1.4 (ref 1.2–2.2)
Albumin: 4.6 g/dL (ref 3.5–5.5)
BUN / CREAT RATIO: 14 (ref 9–20)
BUN: 10 mg/dL (ref 6–20)
Bilirubin Total: 0.5 mg/dL (ref 0.0–1.2)
CO2: 26 mmol/L (ref 20–29)
Calcium: 10 mg/dL (ref 8.7–10.2)
Chloride: 100 mmol/L (ref 96–106)
Creatinine, Ser: 0.72 mg/dL — ABNORMAL LOW (ref 0.76–1.27)
GFR calc Af Amer: 147 mL/min/{1.73_m2} (ref >59)
GFR calc non Af Amer: 127 mL/min/{1.73_m2} (ref >59)
GLOBULIN, TOTAL: 3.2 g/dL (ref 1.5–4.5)
Glucose: 82 mg/dL (ref 65–99)
POTASSIUM: 4.6 mmol/L (ref 3.5–5.2)
Sodium: 139 mmol/L (ref 134–144)
Total Protein: 7.8 g/dL (ref 6.0–8.5)

## 2017-02-21 LAB — LIPASE: Lipase: 20 U/L (ref 13–78)

## 2017-02-21 LAB — H. PYLORI BREATH TEST: H PYLORI BREATH TEST: POSITIVE — AB

## 2017-02-24 ENCOUNTER — Other Ambulatory Visit: Payer: Self-pay | Admitting: Physician Assistant

## 2017-02-24 MED ORDER — METRONIDAZOLE 250 MG PO TABS: 250.0000 mg | ORAL_TABLET | Freq: Four times a day (QID) | ORAL | 0 refills | Status: AC

## 2017-02-24 MED ORDER — BISMUTH SUBSALICYLATE 525 MG/15ML PO SUSP: 300.0000 mg | Freq: Four times a day (QID) | ORAL | 0 refills | Status: AC

## 2017-02-24 MED ORDER — DOXYCYCLINE HYCLATE 100 MG PO CAPS: 100.0000 mg | ORAL_CAPSULE | Freq: Two times a day (BID) | ORAL | 0 refills | Status: AC

## 2017-02-24 NOTE — Progress Notes (Signed)
Meds ordered this encounter  Medications  . Bismuth Subsalicylate (BISMATROL MAXIMUM STRENGTH) 525 MG/15ML SUSP    Sig: Take 8.6 mLs (300 mg total) by mouth 4 (four) times daily for 14 days.    Dispense:  480 mL    Refill:  0    Order Specific Question:   Supervising Provider    Answer:   Ethelda ChickSMITH, KRISTI M [[id number]]  . doxycycline (VIBRAMYCIN) 100 MG capsule    Sig: Take 1 capsule (100 mg total) by mouth 2 (two) times daily for 4 days.    Dispense:  8 capsule    Refill:  0    Order Specific Question:   Supervising Provider    Answer:   Ethelda ChickSMITH, KRISTI M [[id number]]  . metroNIDAZOLE (FLAGYL) 250 MG tablet    Sig: Take 1 tablet (250 mg total) by mouth 4 (four) times daily for 14 days.    Dispense:  56 tablet    Refill:  0    Order Specific Question:   Supervising Provider    Answer:   Ethelda ChickSMITH, KRISTI M [[id number]]

## 2017-03-03 ENCOUNTER — Telehealth: Payer: Self-pay

## 2017-05-15 ENCOUNTER — Ambulatory Visit: Payer: BLUE CROSS/BLUE SHIELD | Admitting: Physician Assistant

## 2017-05-15 ENCOUNTER — Other Ambulatory Visit: Payer: Self-pay

## 2017-05-15 ENCOUNTER — Encounter: Payer: Self-pay | Admitting: Physician Assistant

## 2017-05-15 VITALS — BP 104/62 | HR 98 | Temp 97.7°F | Resp 18 | Ht 71.93 in | Wt 206.4 lb

## 2017-05-15 DIAGNOSIS — R1013 Epigastric pain: Secondary | ICD-10-CM

## 2017-05-15 NOTE — Progress Notes (Signed)
Jorge Ferguson  MRN: [id number] DOB: 08/07/1990  Subjective:  Jorge Kidneyhi Basaldua is a 27 y.o. male seen in office today for a chief complaint of follow-up on abdominal pain.  Patient was initially evaluated by me for this issue on 02/20/17.  Symptoms include intermittent epigastric pain.  Pain is described as burning.  It does not radiate.  It is associated to food consumption.  Has associated heartburn and belching.  Eats spicy foods occasionally.  Denies constipation, diarrhea, fever, chills, nausea, vomiting.  Drinks alcohol occasionally.  Denies excessive NSAID use.  At last visit, he tested positive for H. pylori.  Was treated with 4 medication regimen.  Notes he took most of the medication but could not tolerate the Doxy so stopped taking that after a few days.  After he completed the course, notes his abdominal pain did improve but returned a couple weeks ago.    Review of Systems  Constitutional: Negative for diaphoresis and fatigue.  Respiratory: Negative for cough.   Cardiovascular: Negative for chest pain.  Genitourinary: Negative for difficulty urinating and dysuria.  Neurological: Negative for dizziness.    Patient Active Problem List   Diagnosis Date Noted  . HNP (herniated nucleus pulposus), lumbar 02/23/2014    Current Outpatient Medications on File Prior to Visit  Medication Sig Dispense Refill  . benzonatate (TESSALON) 100 MG capsule Take 1-2 capsules (100-200 mg total) by mouth 3 (three) times daily as needed for cough. (Patient not taking: Reported on 05/15/2017) 40 capsule 0  . Diphenhyd-Hydrocort-Nystatin (FIRST-DUKES MOUTHWASH) SUSP 1 teaspoon as rinse gargle and spit 4 times a day (Patient not taking: Reported on 02/20/2017) 120 mL 1  . docusate sodium (COLACE) 100 MG capsule Take 1 capsule (100 mg total) by mouth 2 (two) times daily as needed for mild constipation. (Patient not taking: Reported on 01/16/2015) 20 capsule 1  . doxycycline (VIBRAMYCIN) 100 MG capsule Take 1  capsule (100 mg total) by mouth 2 (two) times daily. (Patient not taking: Reported on 05/15/2017) 20 capsule 0  . methocarbamol (ROBAXIN) 500 MG tablet Take 1 tablet (500 mg total) by mouth every 8 (eight) hours as needed for muscle spasms. (Patient not taking: Reported on 01/16/2015) 40 tablet 1  . oxyCODONE-acetaminophen (PERCOCET) 5-325 MG per tablet Take 1 tablet by mouth every 4 (four) hours as needed. (Patient not taking: Reported on 01/16/2015) 40 tablet 0  . [DISCONTINUED] omeprazole (PRILOSEC) 20 MG capsule Take 1 capsule (20 mg total) by mouth daily. 30 capsule 0   No current facility-administered medications on file prior to visit.     Allergies  Allergen Reactions  . Azithromycin Hives  . Hydrocodone Bitart (Antituss) [Hydrocodone] Rash    Pt had reaction to Hycodan syrup- rash. Pt reports he is able to take vicodin without any sign of allergy     Objective:  BP 104/62 (BP Location: Right Arm, Patient Position: Sitting, Cuff Size: Large)   Pulse 98   Temp 97.7 F (36.5 C) (Oral)   Resp 18   Ht 5' 11.93" (1.827 m)   Wt 206 lb 6.4 oz (93.6 kg)   SpO2 98%   BMI 28.05 kg/m   Physical Exam  Constitutional: He is oriented to person, place, and time and well-developed, well-nourished, and in no distress.  HENT:  Head: Normocephalic and atraumatic.  Eyes: Conjunctivae are normal.  Neck: Normal range of motion.  Pulmonary/Chest: Effort normal.  Abdominal: Soft. Normal appearance and bowel sounds are normal. There is no tenderness.  Neurological: He  is alert and oriented to person, place, and time. Gait normal.  Skin: Skin is warm and dry.  Psychiatric: Affect normal.  Vitals reviewed.  Assessment and Plan :  1. Abdominal pain, epigastric Suspect H. pylori is persisting due to noncompliance to medication regimen.  Will test for medication at this time.  Depending on results, will contact patient with further treatment plan.  If negative, consider referral to GI for further  evaluation.  - H. pylori breath test  Benjiman Core PA-C  [facility] at [facility] 05/15/2017 4:02 PM

## 2017-05-15 NOTE — Patient Instructions (Addendum)
We will contact you with your results and discuss further treatment options.   Helicobacter Pylori Infection Helicobacter pylori infection is an infection in the stomach that is caused by the Helicobacter pylori (H. pylori) bacteria. This type of bacteria often lives in the lining of the stomach. The infection can cause ulcers and irritation (gastritis) in some people. It is the most common cause of ulcers in the stomach (gastric ulcer) and in the upper part of the intestine (duodenal ulcer). Having this infection may also increase the risk of stomach cancer and a type of white blood cell cancer (lymphoma) that affects the stomach. What are the causes? H. pylori is a type of bacteria that is often found in the stomachs of healthy people. The bacteria may be passed from person to person through contact with stool or saliva. It is not known why some people develop ulcers, gastritis, or cancer from the infection. What increases the risk? This condition is more likely to develop in people who:  Have family members with the infection.  Live with many other people, such as in a dormitory.  Are of African, Hispanic, or Asian descent.  What are the signs or symptoms? Most people with this infection do not have symptoms. If you do have symptoms, they may include:  Heartburn.  Stomach pain.  Nausea.  Vomiting.  Blood-tinged vomit.  Loss of appetite.  Bad breath.  How is this diagnosed? This condition may be diagnosed based on your symptoms, a physical exam, and various tests. Tests may include:  Blood tests or stool tests to check for the proteins (antibodies) that your body may produce in response to the bacteria. These tests are the best way to confirm the diagnosis.  A breath test to check for the type of gas that the H. pylori bacteria release after breaking down a substance called urea. For the test, you are asked to drink urea. This test is often done after treatment in order to  find out if the treatment worked.  A procedure in which a thin, flexible tube with a tiny camera at the end is placed into your stomach and upper intestine (upper endoscopy). Your health care provider may also take tissue samples (biopsy) to test for H. pylori and cancer.  How is this treated? Treatment for this condition usually involves taking a combination of medicines (triple therapy) for a couple of weeks. Triple therapy includes one medicine to reduce the acid in your stomach and two types of antibiotic medicines. Many drug combinations have been approved for treatment. Treatment usually kills the H. pylori and reduces your risk of cancer. You may need to be tested for H. pylori again after treatment. In some cases, the treatment may need to be repeated. Follow these instructions at home:  Take over-the-counter and prescription medicines only as told by your health care provider.  Take your antibiotics as told by your health care provider. Do not stop taking the antibiotics even if you start to feel better.  You can do all your usual activities and eat what you usually do.  Take steps to prevent future infections: ? Wash your hands often. ? Make sure the food you eat has been properly prepared. ? Drink water only from clean sources.  Keep all follow-up visits as told by your health care provider. This is important. Contact a health care provider if:  Your symptoms do not get better.  Your symptoms return after treatment. This information is not intended to replace advice  given to you by your health care provider. Make sure you discuss any questions you have with your health care provider. Document Released: 06/05/2015 Document Revised: 07/20/2015 Document Reviewed: 02/23/2014 Elsevier Interactive Patient Education  2018 [employer].   IF you received an x-ray today, you will receive an invoice from [facility]. Please contact [facility] at [phone] with  questions or concerns regarding your invoice.   IF you received labwork today, you will receive an invoice from [facility]. Please contact [facility] at [phone] with questions or concerns regarding your invoice.   Our billing staff will not be able to assist you with questions regarding bills from these companies.  You will be contacted with the lab results as soon as they are available. The fastest way to get your results is to activate your My Chart account. Instructions are located on the last page of this paperwork. If you have not heard from [place] regarding the results in 2 weeks, please contact this office.

## 2017-05-16 ENCOUNTER — Encounter: Payer: Self-pay | Admitting: Physician Assistant

## 2017-05-17 ENCOUNTER — Encounter: Payer: Self-pay | Admitting: Physician Assistant

## 2017-05-17 LAB — H. PYLORI BREATH TEST: H pylori Breath Test: NEGATIVE

## 2017-05-19 ENCOUNTER — Other Ambulatory Visit: Payer: Self-pay | Admitting: Physician Assistant

## 2017-05-19 DIAGNOSIS — R1013 Epigastric pain: Secondary | ICD-10-CM

## 2017-05-19 NOTE — Progress Notes (Signed)
Orders Placed This Encounter  Procedures  . Ambulatory referral to Gastroenterology    Referral Priority:  Routine    Referral Type:  Consultation    Referral Reason:  Specialty Services Required    Number of Visits Requested:  1     

## 2017-05-28 ENCOUNTER — Encounter: Payer: Self-pay | Admitting: Internal Medicine

## 2017-07-09 ENCOUNTER — Encounter: Payer: Self-pay | Admitting: Internal Medicine

## 2017-07-09 ENCOUNTER — Ambulatory Visit: Payer: BLUE CROSS/BLUE SHIELD | Admitting: Internal Medicine

## 2017-07-09 VITALS — BP 110/80 | HR 80 | Ht 71.0 in | Wt 205.8 lb

## 2017-07-09 DIAGNOSIS — R1013 Epigastric pain: Secondary | ICD-10-CM | POA: Diagnosis not present

## 2017-07-09 NOTE — Progress Notes (Signed)
Jorge Ferguson 27 y.o. February 05, 1991 [id number] In December a CBC and comprehensive metabolic panel were normal.Referred by: Jorge Core D, PA*  Assessment & Plan:   Encounter Diagnosis  Name Primary?  . Dyspepsia Yes   He has dyspepsia, etiology not clear.   he could have postinflammatory problems, despite eradicating H. pylori he still has issues.  Probably functional in origin.  I do not think an upper GI endoscopy is necessary at this time due to lack of warning problemsAnd his young age.  FD Gard 2 before meals to see if that helps  F/U 09/11/17  Call back/message in between prn  I appreciate the opportunity to care for this patient. CC: Jorge River, PA-C  Subjective:   Chief Complaint: bloated  HPI The patient is a 27 year old Asian man with complaints of some nausea, and a bloated feeling after eating and a lot of burping particularly in the morning.  He was treated for H. pylori in the past, with documented eradication.He was having burning epigastric pain late last year,  Was found to have a positive H. pylori breath test.  He was treated and then in March of this year documented to be negative for H. pylori.  No unintentional weight loss no dysphagia no bleeding or anemia.  Says he had history of gastrointestinal problems when he was a child.  GI review of systems is otherwise negative.  There is no history of gastrointestinal illness prior to the start of Jorge Ferguson.  He has tried a PPI also.  CBC and CMET were normal in late December 2018. Allergies  Allergen Reactions  . Azithromycin Hives  . Hydrocodone Bitart (Antituss) [Hydrocodone] Rash    Pt had reaction to Hycodan syrup- rash. Pt reports he is able to take vicodin without any sign of allergy   Current Meds  Medication Sig  . OVER THE COUNTER MEDICATION FDgard Take as directed.   Past Medical History:  Diagnosis Date  . H. pylori infection 2018   + UBT, negative f/u test  . HNP (herniated nucleus  pulposus), lumbar    Past Surgical History:  Procedure Laterality Date  . LEG SURGERY  2008/2014   due to fracture has rod Jorge Ferguson  . LUMBAR LAMINECTOMY/DECOMPRESSION MICRODISCECTOMY Right 02/23/2014   Procedure: MICRO LUMBAR DECOMPRESSION L4 - L5 ON THE RIGHT  1 LEVEL;  Surgeon: Jorge Docker, MD;  Location: [facility];  Service: Orthopedics;  Laterality: Right;   Social History   Social History Narrative   Patient is single, he has 2 sons and 2 daughters, children born 2005 2013 2015 2019.  He is a Forensic psychologist.  1 caffeinated beverage daily.  He does smoke.  1 caffeinated beverage daily no drug use.   family history includes Diabetes in his mother; Hypertension in his father.   Review of Systems   Objective:   Physical Exam  110/80   Pulse 80   Ht  (1.803 m)   Wt 205 lb 12.8 oz (93.4 kg)   BMI 28.70 kg/m @  General:  Well-developed, well-nourished and in no acute distress Eyes:  anicteric. ENT:   Mouth and posterior pharynx free of lesions.  Neck:   supple w/o thyromegaly or mass.  Lungs: Clear to auscultation bilaterally. Heart:  S1S2, no rubs, murmurs, gallops. Abdomen:  soft, non-tender, no hepatosplenomegaly, hernia, or mass and BS+.  Lymph:  no cervical or supraclavicular adenopathy. Extremities:   no edema, cyanosis or clubbing Skin   no rash. Neuro:  A&O x 3.  Psych:  appropriate mood and  Affect.   Data Reviewed: See HPI

## 2017-07-09 NOTE — Patient Instructions (Addendum)
Try the samples of FDgard that we are giving you today. If you like this it can be purchased over the counter.  If the FDgard helps you can cancel your follow up appointment.   Follow up appointment made for 09/11/17 at 10:00AM.   I appreciate the opportunity to care for you. Stan Head, MD, [facility]

## 2017-07-13 ENCOUNTER — Encounter: Payer: Self-pay | Admitting: Internal Medicine

## 2017-08-05 NOTE — Telephone Encounter (Signed)
done in error 

## 2017-09-09 ENCOUNTER — Other Ambulatory Visit: Payer: Self-pay

## 2017-09-09 ENCOUNTER — Ambulatory Visit (INDEPENDENT_AMBULATORY_CARE_PROVIDER_SITE_OTHER): Payer: BLUE CROSS/BLUE SHIELD | Admitting: Physician Assistant

## 2017-09-09 ENCOUNTER — Encounter: Payer: Self-pay | Admitting: Physician Assistant

## 2017-09-09 VITALS — BP 126/70 | HR 99 | Temp 98.0°F | Resp 18 | Ht 71.26 in | Wt 206.4 lb

## 2017-09-09 DIAGNOSIS — Z0000 Encounter for general adult medical examination without abnormal findings: Secondary | ICD-10-CM | POA: Diagnosis not present

## 2017-09-09 DIAGNOSIS — Z23 Encounter for immunization: Secondary | ICD-10-CM

## 2017-09-09 DIAGNOSIS — Z13220 Encounter for screening for lipoid disorders: Secondary | ICD-10-CM | POA: Diagnosis not present

## 2017-09-09 DIAGNOSIS — Z114 Encounter for screening for human immunodeficiency virus [HIV]: Secondary | ICD-10-CM | POA: Diagnosis not present

## 2017-09-09 DIAGNOSIS — Z1389 Encounter for screening for other disorder: Secondary | ICD-10-CM

## 2017-09-09 NOTE — Progress Notes (Signed)
Jorge Ferguson  MRN: [id number] DOB: 1990/10/10  Subjective:  Pt is a 27 y.o. male who presents for annual physical exam. Pt is not fasting today. Ate rice an hour ago.  Diet: Varies: eats a well balanced meal but also eats junk. Not much dairy. No multivitamin. Drinks mostly water.  Exercise: No structured exercise. Work is strenuous  Sleep: ~7 hours daily  BM: Daily  Last dental exam: 1 year ago, brushes BID Last vision exam: Once a year  Vaccinations      Tetanus: >10 year ago      Patient Active Problem List   Diagnosis Date Noted  . HNP (herniated nucleus pulposus), lumbar 02/23/2014    Current Outpatient Medications on File Prior to Visit  Medication Sig Dispense Refill  . OVER THE COUNTER MEDICATION FDgard Take as directed.    . [DISCONTINUED] omeprazole (PRILOSEC) 20 MG capsule Take 1 capsule (20 mg total) by mouth daily. 30 capsule 0   No current facility-administered medications on file prior to visit.     Allergies  Allergen Reactions  . Azithromycin Hives  . Hydrocodone Bitart (Antituss) [Hydrocodone] Rash    Pt had reaction to Hycodan syrup- rash. Pt reports he is able to take vicodin without any sign of allergy    Social History   Socioeconomic History  . Marital status: Single    Spouse name: Not on file  . Number of children: 4  . Years of education: Not on file  . Highest education level: Not on file  Occupational History  . Occupation: Production designer, theatre/television/film  Social Needs  . Financial resource strain: Not on file  . Food insecurity:    Worry: Not on file    Inability: Not on file  . Transportation needs:    Medical: Not on file    Non-medical: Not on file  Tobacco Use  . Smoking status: Current Every Day Smoker    Types: E-cigarettes  . Smokeless tobacco: Never Used  Substance and Sexual Activity  . Alcohol use: No  . Drug use: No  . Sexual activity: Yes  Lifestyle  . Physical activity:    Days per week: Not on file    Minutes per session: Not  on file  . Stress: Not on file  Relationships  . Social connections:    Talks on phone: Not on file    Gets together: Not on file    Attends religious service: Not on file    Active member of club or organization: Not on file    Attends meetings of clubs or organizations: Not on file    Relationship status: Not on file  Other Topics Concern  . Not on file  Social History Narrative   Patient is single, he has 2 sons and 2 daughters, children born 2005 2013 2015 2019.  He is a Forensic psychologist.  1 caffeinated beverage daily.  He does smoke.  1 caffeinated beverage daily no drug use.    Past Surgical History:  Procedure Laterality Date  . EYE SURGERY    . LEG SURGERY  2008/2014   due to fracture has rod [name]  . LUMBAR LAMINECTOMY/DECOMPRESSION MICRODISCECTOMY Right 02/23/2014   Procedure: MICRO LUMBAR DECOMPRESSION L4 - L5 ON THE RIGHT  1 LEVEL;  Surgeon: [name], MD;  Location: [facility];  Service: Orthopedics;  Laterality: Right;    Family History  Problem Relation Age of Onset  . Diabetes Mother   . Hyperlipidemia Mother   . Hypertension  Father   . Hyperlipidemia Father     Review of Systems  Constitutional: Negative for activity change, appetite change, chills, diaphoresis, fatigue, fever and unexpected weight change.  HENT: Negative for congestion, dental problem, drooling, ear discharge, ear pain, facial swelling, hearing loss, mouth sores, nosebleeds, postnasal drip, rhinorrhea, sinus pressure, sinus pain, sneezing, sore throat, tinnitus, trouble swallowing and voice change.   Eyes: Negative for photophobia, pain, discharge, redness, itching and visual disturbance.  Respiratory: Negative for apnea, cough, choking, chest tightness, shortness of breath, wheezing and stridor.   Cardiovascular: Negative for chest pain, palpitations and leg swelling.  Gastrointestinal: Negative for abdominal distention, abdominal pain, anal bleeding, blood in stool, constipation,  diarrhea, nausea, rectal pain and vomiting.  Endocrine: Negative for cold intolerance, heat intolerance, polydipsia, polyphagia and polyuria.  Genitourinary: Negative for decreased urine volume, difficulty urinating, discharge, dysuria, enuresis, flank pain, frequency, genital sores, hematuria, penile pain, penile swelling, scrotal swelling, testicular pain and urgency.  Musculoskeletal: Positive for myalgias. Negative for arthralgias, back pain, gait problem, joint swelling, neck pain and neck stiffness.  Skin: Negative for color change, pallor, rash and wound.  Allergic/Immunologic: Negative for environmental allergies, food allergies and immunocompromised state.  Neurological: Negative for dizziness, tremors, seizures, syncope, facial asymmetry, speech difficulty, weakness, light-headedness, numbness and headaches.  Hematological: Negative for adenopathy. Does not bruise/bleed easily.  Psychiatric/Behavioral: Negative for agitation, behavioral problems, confusion, decreased concentration, dysphoric mood, hallucinations, self-injury, sleep disturbance and suicidal ideas. The patient is not nervous/anxious and is not hyperactive.     Objective:  BP 126/70   Pulse 99   Temp 98 F (36.7 C) (Oral)   Resp 18   Ht 5' 11.26" (1.81 m)   Wt 206 lb 6.4 oz (93.6 kg)   SpO2 98%   BMI 28.58 kg/m   Physical Exam  Constitutional: He is oriented to person, place, and time. He appears well-developed and well-nourished. No distress.  HENT:  Head: Normocephalic and atraumatic.  Right Ear: Hearing, tympanic membrane, external ear and ear canal normal.  Left Ear: Hearing, tympanic membrane, external ear and ear canal normal.  Nose: Nose normal.  Mouth/Throat: Uvula is midline, oropharynx is clear and moist and mucous membranes are normal. No oropharyngeal exudate.  Eyes: Pupils are equal, round, and reactive to light. Conjunctivae and EOM are normal.  Neck: Trachea normal and normal range of motion.    Cardiovascular: Normal rate, regular rhythm, normal heart sounds and intact distal pulses.  Pulmonary/Chest: Effort normal and breath sounds normal.  Abdominal: Soft. Normal appearance and bowel sounds are normal.  Musculoskeletal: Normal range of motion.  Lymphadenopathy:       Head (right side): No submental, no submandibular, no tonsillar, no preauricular, no posterior auricular and no occipital adenopathy present.       Head (left side): No submental, no submandibular, no tonsillar, no preauricular, no posterior auricular and no occipital adenopathy present.    He has no cervical adenopathy.       Right: No supraclavicular adenopathy present.       Left: No supraclavicular adenopathy present.  Neurological: He is alert and oriented to person, place, and time. He has normal strength and normal reflexes.  Skin: Skin is warm and dry.  Vitals reviewed.   Visual Acuity Screening   Right eye Left eye Both eyes  Without correction: 20/13 20/13 20/10   With correction:      Wt Readings from Last 3 Encounters:  09/09/17 206 lb 6.4 oz (93.6 kg)  07/09/17 205 lb  12.8 oz (93.4 kg)  05/15/17 206 lb 6.4 oz (93.6 kg)    Assessment and Plan :  Discussed healthy lifestyle, diet, exercise, preventative care, vaccinations, and addressed patient's concerns. Plan for follow up in one year. Otherwise, plan for specific conditions below.  1. Annual physical exam 2. Screening cholesterol level - Lipid panel  3. Screening for HIV (human immunodeficiency virus) - HIV antibody  4. Screening for hematuria or proteinuria - Urinalysis, dipstick only  5. Need for Tdap vaccination    Benjiman Core, PA-C  [facility] at [facility] 09/09/2017 7:21 PM

## 2017-09-09 NOTE — Patient Instructions (Addendum)
Health Maintenance, Male A healthy lifestyle and preventive care is important for your health and wellness. Ask your health care provider about what schedule of regular examinations is right for you. What should I know about weight and diet? Eat a Healthy Diet  Eat plenty of vegetables, fruits, whole grains, low-fat dairy products, and lean protein.  Do not eat a lot of foods high in solid fats, added sugars, or salt.  Maintain a Healthy Weight Regular exercise can help you achieve or maintain a healthy weight. You should:  Do at least 150 minutes of exercise each week. The exercise should increase your heart rate and make you sweat (moderate-intensity exercise).  Do strength-training exercises at least twice a week.  Watch Your Levels of Cholesterol and Blood Lipids  Have your blood tested for lipids and cholesterol every 5 years starting at 29 years of age. If you are at high risk for heart disease, you should start having your blood tested when you are 29 years old. You may need to have your cholesterol levels checked more often if: ? Your lipid or cholesterol levels are high. ? You are older than 29 years of age. ? You are at high risk for heart disease.  What should I know about cancer screening? Many types of cancers can be detected early and may often be prevented. Lung Cancer  You should be screened every year for lung cancer if: ? You are a current smoker who has smoked for at least 30 years. ? You are a former smoker who has quit within the past 15 years.  Talk to your health care provider about your screening options, when you should start screening, and how often you should be screened.  Colorectal Cancer  Routine colorectal cancer screening usually begins at 29 years of age and should be repeated every 5-10 years until you are 29 years old. You may need to be screened more often if early forms of precancerous polyps or small growths are found. Your health care provider  may recommend screening at an earlier age if you have risk factors for colon cancer.  Your health care provider may recommend using home test kits to check for hidden blood in the stool.  A small camera at the end of a tube can be used to examine your colon (sigmoidoscopy or colonoscopy). This checks for the earliest forms of colorectal cancer.  Prostate and Testicular Cancer  Depending on your age and overall health, your health care provider may do certain tests to screen for prostate and testicular cancer.  Talk to your health care provider about any symptoms or concerns you have about testicular or prostate cancer.  Skin Cancer  Check your skin from head to toe regularly.  Tell your health care provider about any new moles or changes in moles, especially if: ? There is a change in a mole's size, shape, or color. ? You have a mole that is larger than a pencil eraser.  Always use sunscreen. Apply sunscreen liberally and repeat throughout the day.  Protect yourself by wearing long sleeves, pants, a wide-brimmed hat, and sunglasses when outside.  What should I know about heart disease, diabetes, and high blood pressure?  If you are 18-39 years of age, have your blood pressure checked every 3-5 years. If you are 40 years of age or older, have your blood pressure checked every year. You should have your blood pressure measured twice-once when you are at a hospital or clinic, and once when   you are not at a hospital or clinic. Record the average of the two measurements. To check your blood pressure when you are not at a hospital or clinic, you can use: ? An automated blood pressure machine at a pharmacy. ? A home blood pressure monitor.  Talk to your health care provider about your target blood pressure.  If you are between 45-79 years old, ask your health care provider if you should take aspirin to prevent heart disease.  Have regular diabetes screenings by checking your fasting blood  sugar level. ? If you are at a normal weight and have a low risk for diabetes, have this test once every three years after the age of 45. ? If you are overweight and have a high risk for diabetes, consider being tested at a younger age or more often.  A one-time screening for abdominal aortic aneurysm (AAA) by ultrasound is recommended for men aged 65-75 years who are current or former smokers. What should I know about preventing infection? Hepatitis B If you have a higher risk for hepatitis B, you should be screened for this virus. Talk with your health care provider to find out if you are at risk for hepatitis B infection. Hepatitis C Blood testing is recommended for:  Everyone born from 1945 through 1965.  Anyone with known risk factors for hepatitis C.  Sexually Transmitted Diseases (STDs)  You should be screened each year for STDs including gonorrhea and chlamydia if: ? You are sexually active and are younger than 29 years of age. ? You are older than 29 years of age and your health care provider tells you that you are at risk for this type of infection. ? Your sexual activity has changed since you were last screened and you are at an increased risk for chlamydia or gonorrhea. Ask your health care provider if you are at risk.  Talk with your health care provider about whether you are at high risk of being infected with HIV. Your health care provider may recommend a prescription medicine to help prevent HIV infection.  What else can I do?  Schedule regular health, dental, and eye exams.  Stay current with your vaccines (immunizations).  Do not use any tobacco products, such as cigarettes, chewing tobacco, and e-cigarettes. If you need help quitting, ask your health care provider.  Limit alcohol intake to no more than 2 drinks per day. One drink equals 12 ounces of beer, 5 ounces of wine, or 1 ounces of hard liquor.  Do not use street drugs.  Do not share needles.  Ask your  health care provider for help if you need support or information about quitting drugs.  Tell your health care provider if you often feel depressed.  Tell your health care provider if you have ever been abused or do not feel safe at home. This information is not intended to replace advice given to you by your health care provider. Make sure you discuss any questions you have with your health care provider. Document Released: 08/10/2007 Document Revised: 10/11/2015 Document Reviewed: 11/15/2014 Elsevier Interactive Patient Education  2018 [employer]     IF you received an x-ray today, you will receive an invoice from [facility]. Please contact [facility] at [phone] with questions or concerns regarding your invoice.   IF you received labwork today, you will receive an invoice from [facility]. Please contact [facility] at [phone] with questions or concerns regarding your invoice.   Our billing staff will not be   able to assist you with questions regarding bills from these companies.  You will be contacted with the lab results as soon as they are available. The fastest way to get your results is to activate your My Chart account. Instructions are located on the last page of this paperwork. If you have not heard from us regarding the results in 2 weeks, please contact this office.       

## 2017-09-10 LAB — URINALYSIS, DIPSTICK ONLY
Bilirubin, UA: NEGATIVE
GLUCOSE, UA: NEGATIVE
KETONES UA: NEGATIVE
Leukocytes, UA: NEGATIVE
Nitrite, UA: NEGATIVE
PROTEIN UA: NEGATIVE
RBC, UA: NEGATIVE
Specific Gravity, UA: 1.024 (ref 1.005–1.030)
Urobilinogen, Ur: 0.2 mg/dL (ref 0.2–1.0)
pH, UA: 7.5 (ref 5.0–7.5)

## 2017-09-10 LAB — LIPID PANEL
CHOL/HDL RATIO: 3.2 ratio (ref 0.0–5.0)
CHOLESTEROL TOTAL: 120 mg/dL (ref 100–199)
HDL: 37 mg/dL — ABNORMAL LOW (ref >39)
LDL Calculated: 48 mg/dL (ref 0–99)
Triglycerides: 174 mg/dL — ABNORMAL HIGH (ref 0–149)
VLDL CHOLESTEROL CAL: 35 mg/dL (ref 5–40)

## 2017-09-10 LAB — HIV ANTIBODY (ROUTINE TESTING W REFLEX): HIV Screen 4th Generation wRfx: NONREACTIVE

## 2017-09-11 ENCOUNTER — Ambulatory Visit: Payer: BLUE CROSS/BLUE SHIELD | Admitting: Internal Medicine

## 2017-09-15 ENCOUNTER — Emergency Department (HOSPITAL_COMMUNITY): Payer: BLUE CROSS/BLUE SHIELD

## 2017-09-15 ENCOUNTER — Ambulatory Visit: Payer: Self-pay

## 2017-09-15 ENCOUNTER — Encounter (HOSPITAL_COMMUNITY): Payer: Self-pay | Admitting: Emergency Medicine

## 2017-09-15 ENCOUNTER — Emergency Department (HOSPITAL_COMMUNITY)
Admission: EM | Admit: 2017-09-15 | Discharge: 2017-09-15 | Disposition: A | Discharge status: Home / Self Care | Payer: BLUE CROSS/BLUE SHIELD | Attending: Emergency Medicine | Admitting: Emergency Medicine

## 2017-09-15 DIAGNOSIS — M79601 Pain in right arm: Secondary | ICD-10-CM

## 2017-09-15 DIAGNOSIS — M79602 Pain in left arm: Secondary | ICD-10-CM | POA: Diagnosis not present

## 2017-09-15 DIAGNOSIS — F17210 Nicotine dependence, cigarettes, uncomplicated: Secondary | ICD-10-CM | POA: Insufficient documentation

## 2017-09-15 DIAGNOSIS — R202 Paresthesia of skin: Secondary | ICD-10-CM | POA: Diagnosis not present

## 2017-09-15 DIAGNOSIS — R200 Anesthesia of skin: Secondary | ICD-10-CM | POA: Diagnosis present

## 2017-09-15 LAB — BASIC METABOLIC PANEL
ANION GAP: 7 (ref 5–15)
BUN: 13 mg/dL (ref 6–20)
CALCIUM: 9.4 mg/dL (ref 8.9–10.3)
CO2: 30 mmol/L (ref 22–32)
Chloride: 105 mmol/L (ref 98–111)
Creatinine, Ser: 0.65 mg/dL (ref 0.61–1.24)
GFR calc non Af Amer: >60 mL/min (ref >60)
Glucose, Bld: 86 mg/dL (ref 70–99)
Potassium: 3.8 mmol/L (ref 3.5–5.1)
SODIUM: 142 mmol/L (ref 135–145)

## 2017-09-15 LAB — CBC WITH DIFFERENTIAL/PLATELET
BASOS ABS: 0 10*3/uL (ref 0.0–0.1)
BASOS PCT: 0 %
Eosinophils Absolute: 0.3 10*3/uL (ref 0.0–0.7)
Eosinophils Relative: 4 %
HEMATOCRIT: 45.5 % (ref 39.0–52.0)
Hemoglobin: 15.3 g/dL (ref 13.0–17.0)
LYMPHS PCT: 30 %
Lymphs Abs: 2.1 10*3/uL (ref 0.7–4.0)
MCH: 29.8 pg (ref 26.0–34.0)
MCHC: 33.6 g/dL (ref 30.0–36.0)
MCV: 88.7 fL (ref 78.0–100.0)
Monocytes Absolute: 0.6 10*3/uL (ref 0.1–1.0)
Monocytes Relative: 8 %
NEUTROS PCT: 58 %
Neutro Abs: 4 10*3/uL (ref 1.7–7.7)
Platelets: 213 10*3/uL (ref 150–400)
RBC: 5.13 MIL/uL (ref 4.22–5.81)
RDW: 12 % (ref 11.5–15.5)
WBC: 6.9 10*3/uL (ref 4.0–10.5)

## 2017-09-15 MED ORDER — LIDOCAINE 5 % EX PTCH: 1.0000 | MEDICATED_PATCH | CUTANEOUS | 0 refills | Status: DC

## 2017-09-15 NOTE — ED Provider Notes (Signed)
[facility]-EMERGENCY DEPT Provider Note   CSN: [id number] Arrival date & time: 09/15/17  1206     History   Chief Complaint Chief Complaint  Patient presents with  . arm numbness    bilat  . arm tingling    bilat    HPI Jorge Ferguson is a 27 y.o. male presenting for a 4-day history of bilateral arm numbness, tingling.  This problems began gradually 4 days ago and has increased, patient states that the numbness and tingling wake him up 3-4 times every night.  Patient states that his numbness and tingling is worse in his left arm compared to his right arm.  Patient states that nothing particular makes his pain worse except for sleeping.  Patient has taken ibuprofen for his problem without relief.   Patient denies recent fever or illness, swelling, color change, recent injury, neck pain, back pain, headache, vision changes.  Of note patient states he is undergone lumbar surgery for a decompression approximately 3 years ago, denies complications from this procedure. HPI  Past Medical History:  Diagnosis Date  . H. pylori infection 2018   + UBT, negative f/u test  . HNP (herniated nucleus pulposus), lumbar     Patient Active Problem List   Diagnosis Date Noted  . HNP (herniated nucleus pulposus), lumbar 02/23/2014    Past Surgical History:  Procedure Laterality Date  . EYE SURGERY    . LEG SURGERY  2008/2014   due to fracture has rod [name]  . LUMBAR LAMINECTOMY/DECOMPRESSION MICRODISCECTOMY Right 02/23/2014   Procedure: MICRO LUMBAR DECOMPRESSION L4 - L5 ON THE RIGHT  1 LEVEL;  Surgeon: [name], MD;  Location: [facility];  Service: Orthopedics;  Laterality: Right;        Home Medications    Prior to Admission medications   Medication Sig Start Date End Date Taking? Authorizing Provider  lidocaine (LIDODERM) 5 % Place 1 patch onto the skin daily. Remove & Discard patch within 12 hours or as directed by MD 09/15/17   Bill Salinas, PA-C    omeprazole (PRILOSEC) 20 MG capsule Take 1 capsule (20 mg total) by mouth daily. 02/20/17 02/24/17  [name], PA-C    Family History Family History  Problem Relation Age of Onset  . Diabetes Mother   . Hyperlipidemia Mother   . Hypertension Father   . Hyperlipidemia Father     Social History Social History   Tobacco Use  . Smoking status: Current Every Day Smoker    Packs/day: 0.25    Types: Cigarettes    Start date: 07/10/2017  . Smokeless tobacco: Never Used  Substance Use Topics  . Alcohol use: No  . Drug use: No     Allergies   Azithromycin and Hydrocodone bitart (antituss) [hydrocodone]   Review of Systems Review of Systems  Constitutional: Negative.  Negative for chills, fatigue and fever.  HENT: Negative.  Negative for congestion, ear pain, rhinorrhea, sore throat and trouble swallowing.   Eyes: Negative.  Negative for visual disturbance.  Respiratory: Negative.  Negative for cough, chest tightness and shortness of breath.   Cardiovascular: Negative.  Negative for chest pain and leg swelling.  Gastrointestinal: Negative.  Negative for abdominal pain, blood in stool, diarrhea, nausea and vomiting.  Genitourinary: Negative.  Negative for dysuria, flank pain and hematuria.  Musculoskeletal: Negative.  Negative for arthralgias, myalgias and neck pain.  Skin: Negative.  Negative for rash.  Neurological: Positive for numbness. Negative for dizziness, syncope, weakness,  light-headedness and headaches.     Physical Exam Updated Vital Signs BP 111/75   Pulse (!) 58   Temp 97.9 F (36.6 C) (Oral)   Resp 18   Ht 5\' 11"  (1.803 m)   Wt 93.4 kg (206 lb)   SpO2 100%   BMI 28.73 kg/m   Physical Exam  Constitutional: He is oriented to person, place, and time. He appears well-developed and well-nourished. No distress.  HENT:  Head: Normocephalic and atraumatic.  Right Ear: External ear normal.  Left Ear: External ear normal.  Nose: Nose normal.   Mouth/Throat: Uvula is midline, oropharynx is clear and moist and mucous membranes are normal. No oropharyngeal exudate.  Eyes: Pupils are equal, round, and reactive to light. Conjunctivae and EOM are normal.  Neck: Normal range of motion and full passive range of motion without pain. Neck supple. No JVD present. No spinous process tenderness present. No neck rigidity. No tracheal deviation and normal range of motion present.  Cardiovascular: Normal rate, regular rhythm, normal heart sounds and intact distal pulses.  Pulses:      Radial pulses are 3+ on the right side, and 3+ on the left side.  Pulmonary/Chest: Effort normal and breath sounds normal. No respiratory distress.  Abdominal: Soft. Bowel sounds are normal. There is no tenderness. There is no rebound and no guarding.  Musculoskeletal: Normal range of motion. He exhibits no edema or deformity.       Cervical back: Normal. He exhibits no bony tenderness.       Thoracic back: Normal. He exhibits no bony tenderness.       Lumbar back: Normal. He exhibits no bony tenderness.       Arms: No midline spine TTP, no paraspinal muscle tenderness, no deformity, crepitus, or step-off noted. Well-healed small surgical scar on lumbar spine, no signs of infection or swelling.  Neurological: He is alert and oriented to person, place, and time. He has normal strength. No cranial nerve deficit or sensory deficit.  Mental Status:  Alert, oriented, thought content appropriate, able to give a coherent history. Speech fluent without evidence of aphasia. Able to follow 2 step commands without difficulty.  Cranial Nerves:  II:  Peripheral visual fields grossly normal, pupils equal, round, reactive to light III,IV, VI: ptosis not present, extra-ocular motions intact bilaterally  V,VII: smile symmetric, eyebrows raise symmetric, facial light touch sensation equal VIII: hearing grossly normal to voice  X: uvula elevates symmetrically  XI: bilateral shoulder  shrug symmetric and strong XII: midline tongue extension without fassiculations Motor:  Normal tone. 5/5 in upper and lower extremities bilaterally including strong and equal grip strength and dorsiflexion/plantar flexion Sensory: Sensation intact to light touch in all extremities. Negative Romberg.  Cerebellar: normal finger-to-nose with bilateral upper extremities. No pronator drift.  Gait: normal gait and balance CV: distal pulses palpable throughout   Skin: Skin is warm and dry. Capillary refill takes less than 2 seconds.  Psychiatric: He has a normal mood and affect. His behavior is normal.    ED Treatments / Results  Labs (all labs ordered are listed, but only abnormal results are displayed) Labs Reviewed  CBC WITH DIFFERENTIAL/PLATELET  BASIC METABOLIC PANEL    EKG None  Radiology Dg Chest 2 View  Result Date: 09/15/2017 CLINICAL DATA:  Intermittent bilateral arm numbness and tingling for 3-4 days. EXAM: CHEST - 2 VIEW COMPARISON:  06/06/2005 FINDINGS: And Normal heart size and mediastinal contours. No acute infiltrate or edema. No effusion or pneumothorax. No osseous  findings. IMPRESSION: Negative chest. Electronically Signed   By: Marnee Spring M.D.   On: 09/15/2017 16:52    Procedures Procedures (including critical care time)  Medications Ordered in ED Medications - No data to display   Initial Impression / Assessment and Plan / ED Course  I have reviewed the triage vital signs and the nursing notes.  Pertinent labs & imaging results that were available during my care of the patient were reviewed by me and considered in my medical decision making (see chart for details).  Clinical Course as of Sep 16 2347  Mon Sep 15, 2017  5851 27 year old healthy male complaining of 5 days of numbness burning tingling sensation in his bilateral upper extremities from his shoulders on down to his fingers.  He has also notices some fatigue in his arm with activity.  Does not fit  any radicular dermatome but more stocking glove.  Is got a good pulse but wonder if there is may be something vascular that would account for this.  He is getting some basic labs and a chest x-ray.   [MB]    Clinical Course User Index [MB] Terrilee Files, MD   Patient presenting with intermittent bilateral upper extremity numbness and tingling worse left compared to right. CBC, BMP, chest x-ray within normal limits today.  The patient's distribution does not follow a single dermatome, it seems to be diffuse throughout his entire left upper arm going down to his entire hand as well as primarily the right forearm and right hand.  No history of injury, no neck pain, no tenderness to palpation of the spinal processes, no step-off or deformity noted to the cervical/thoracic/lumbar spine.    Patient is without signs of infection, no fever, nausea or vomiting, no meningeal signs. Patient has strong bilateral radial pulses, cap refill intact, full strength and sensation to both upper extremities bilaterally.  Patient denies of swelling or color change to his upper extremities.  Patient does have tenderness to his left trapezius muscle to palpation which worsens his pain and paresthesias of his left hand.  I have prescribed the patient a Lidoderm patch for his left trapezius muscle to go home with.  I have given patient follow-up with [facility] neurology for further evaluation and I have encouraged the patient to call them first thing tomorrow morning to schedule an appointment.  Patient has also been seen and evaluated by Dr. Charm Barges who agrees with plan of care to discharge with neurology follow-up.  At this time there does not appear to be any evidence of an acute emergency medical condition and the patient appears stable for discharge with appropriate outpatient follow up. Diagnosis was discussed with patient who verbalizes understanding and is agreeable to discharge. I have discussed return precautions  with patient and family who verbalize understanding of return precautions. Patient strongly encouraged to follow-up with their PCP.  Patient's case discussed with Dr. Charm Barges who agrees with plan to discharge with follow-up.     This note was dictated using DragonOne dictation software; please contact for any inconsistencies within the note.   Final Clinical Impressions(s) / ED Diagnoses   Final diagnoses:  Paresthesia and pain of both upper extremities    ED Discharge Orders        Ordered    lidocaine (LIDODERM) 5 %  Every 24 hours     09/15/17 1721       Elizabeth Palau 09/15/17 2349    Terrilee Files, MD 09/16/17 1200

## 2017-09-15 NOTE — Telephone Encounter (Signed)
Pt. Reports 4 day history of bilateral arm and hand numbness/tingling. Has sharp pain at night. Starts at upper arms and goes down both arms to his hands. Hands and arms feel weak. No known injury. Able to move without difficulty. Pt. Will go to ED for evaluation per FC.  Reason for Disposition . [1] Numbness (i.e., loss of sensation) of the face, arm / hand, or leg / foot on one side of the body AND [2] gradual onset (e.g., days to weeks) AND [3] present now  Answer Assessment - Initial Assessment Questions 1. SYMPTOM: "What is the main symptom you are concerned about?" (e.g., weakness, numbness)     Bilateral arm and numbness x 4 days 2. ONSET: "When did this start?" (minutes, hours, days; while sleeping)     4 dAYS AGO 3. LAST NORMAL: "When was the last time you were normal (no symptoms)?"     4 DAYS AGO 4. PATTERN "Does this come and go, or has it been constant since it started?"  "Is it present now?"     cONSTANT 5. CARDIAC SYMPTOMS: "Have you had any of the following symptoms: chest pain, difficulty breathing, palpitations?"     No 6. NEUROLOGIC SYMPTOMS: "Have you had any of the following symptoms: headache, dizziness, vision loss, double vision, changes in speech, unsteady on your feet?"     Weakness to arms and hands 7. OTHER SYMPTOMS: "Do you have any other symptoms?"     No 8. PREGNANCY: "Is there any chance you are pregnant?" "When was your last menstrual period?"     n/a  Protocols used: NEUROLOGIC DEFICIT-A-AH

## 2017-09-15 NOTE — ED Triage Notes (Signed)
Pt reports that he been having intermittent bilat arm numbness and tingling for 3-4 days. Reports is worse in mornings when he wakes up. No neuro deficits at this time

## 2017-09-15 NOTE — ED Notes (Signed)
PT DISCHARGED. INSTRUCTIONS AND PRESCRIPTIONS SENT TO [facility]. AAOX4. PT IN NO APPARENT DISTRESS OR PAIN. THE OPPORTUNITY TO ASK QUESTIONS WAS PROVIDED.

## 2017-09-15 NOTE — Discharge Instructions (Addendum)
Please call your primary care provider as soon as you can to follow-up regarding your visit today for further evaluation. Please return to the emergency department for any new or worsening symptoms or if your symptoms do not improve. 3.   You may use the Lidoderm patch over your left trapezius muscle as prescribed. 4.   [facility] first thing tomorrow morning for further evaluation.  Contact a health care provider if: You continue to have episodes of paresthesia. If you have swelling Your burning or prickling feeling gets worse when you walk. You have pain, cramps, or dizziness. You develop a rash. Get help right away if: You feel weak. You have trouble walking or moving. You have problems with speech, understanding, or vision. You feel confused. You cannot control your bladder or bowel movements. You have numbness after an injury. You faint.

## 2017-09-16 ENCOUNTER — Telehealth: Payer: Self-pay | Admitting: Physician Assistant

## 2017-09-16 NOTE — Telephone Encounter (Signed)
Copied from CRM [id number]. Topic: Quick Communication - See Telephone Encounter >> Sep 16, 2017 10:22 AM Mare LoanBurton, Donna F wrote: Pt was seen in the hospital yesterday and was given a rx for lidoderm 5% and the pharmacy states it needs prior authorization and they state this needs to come from  The PCP not the hospital   Best number [phone]

## 2017-09-17 ENCOUNTER — Other Ambulatory Visit: Payer: Self-pay | Admitting: Physician Assistant

## 2017-09-17 MED ORDER — LIDOCAINE 5 % EX PTCH: 1.0000 | MEDICATED_PATCH | CUTANEOUS | 0 refills | Status: DC

## 2017-09-17 NOTE — Telephone Encounter (Signed)
Pt's was here with due to his father's appointment. Pt was able to talk with [name] about his hospital visit and the medication.

## 2017-09-17 NOTE — Progress Notes (Signed)
Meds ordered this encounter  Medications  . lidocaine (LIDODERM) 5 %    Sig: Place 1 patch onto the skin daily. Remove & Discard patch within 12 hours or as directed by MD    Dispense:  30 patch    Refill:  0    Order Specific Question:   Supervising Provider    Answer:   Ethelda ChickSMITH, KRISTI M [[id number]]

## 2017-09-18 ENCOUNTER — Ambulatory Visit: Payer: BLUE CROSS/BLUE SHIELD | Admitting: Neurology

## 2017-09-18 ENCOUNTER — Encounter: Payer: Self-pay | Admitting: Neurology

## 2017-09-18 VITALS — BP 128/81 | HR 75 | Ht 71.0 in | Wt 210.0 lb

## 2017-09-18 DIAGNOSIS — G5603 Carpal tunnel syndrome, bilateral upper limbs: Secondary | ICD-10-CM | POA: Diagnosis not present

## 2017-09-18 DIAGNOSIS — T50A15A Adverse effect of pertussis vaccine, including combinations with a pertussis component, initial encounter: Secondary | ICD-10-CM

## 2017-09-18 MED ORDER — PREDNISONE 20 MG PO TABS: ORAL_TABLET | ORAL | 0 refills | Status: DC

## 2017-09-18 NOTE — Progress Notes (Signed)
Provider:  Melvyn Novasarmen  Kenyia Wambolt, M D  Referring Provider: Magdalene RiverWiseman, Brittany D, PA* Primary Care Physician:  Magdalene RiverWiseman, Brittany D, PA-C  Chief Complaint  Patient presents with  . New Patient (Initial Visit)    pt with mom, rm 10. pt states that for 6 days he has been having numbness and tingling in both arms.he states the pain happens more at night.  he states that he wakes up multiple time during the night. weakness in both arms.     HPI:  Jorge Ferguson is a 27 y.o. male patient who is seen here [facility], 09-18-2017 .  Jorge Ferguson is a 27 year old right-handed gentleman who experienced an acute onset of bilateral hand and wrist pain that was so severe that it woke him out of sleep at night.  During the day the pain got a little better but it returns night after night.  He describes the quality of the pain is an almost stabbing sensation in his wrist.  The pain does radiate to the 3 fingers that are associated with the median nerve.  He also reports some elbow pain, and numbness . His grip is weaker.   He feels overall as of his upper extremities are heavier, which is a subtle sign that they are weaker.  Last Tuesday he got a T- DAP vaccine in his right upper arm- he responded with swelling, painful shoulder..      Review of Systems: Out of a complete 14 system review, the patient complains of only the following symptoms, and all other reviewed systems are negative.  see above   Social History   Socioeconomic History  . Marital status: Single    Spouse name: Not on file  . Number of children: 3  . Years of education: high school  . Highest education level: High school graduate  Occupational History  . Occupation: Production designer, theatre/television/filmManager  Social Needs  . Financial resource strain: Not hard at all  . Food insecurity:    Worry: Never true    Inability: Never true  . Transportation needs:    Medical: No    Non-medical: No  Tobacco Use  . Smoking status: Current  Every Day Smoker    Packs/day: 0.25    Types: Cigarettes    Start date: 07/10/2017  . Smokeless tobacco: Never Used  Substance and Sexual Activity  . Alcohol use: No  . Drug use: No  . Sexual activity: Not Currently    Partners: Female    Comment: with monagamous partner   Lifestyle  . Physical activity:    Days per week: 5 days    Minutes per session: 40 min  . Stress: Not at all  Relationships  . Social connections:    Talks on phone: More than three times a week    Gets together: Once a week    Attends religious service: Never    Active member of club or organization: No    Attends meetings of clubs or organizations: Never    Relationship status: Never married  . Intimate partner violence:    Fear of current or ex partner: Patient refused    Emotionally abused: Patient refused    Physically abused: Patient refused    Forced sexual activity: Patient refused  Other Topics Concern  . Not on file  Social History Narrative   Patient is single, he has 2 sons and 1 daughter. Lives with parents.   He is a  Forensic psychologist.     Family History  Problem Relation Age of Onset  . Diabetes Mother   . Hyperlipidemia Mother   . Hypertension Father   . Hyperlipidemia Father     Past Medical History:  Diagnosis Date  . H. pylori infection 2018   + UBT, negative f/u test  . HNP (herniated nucleus pulposus), lumbar     Past Surgical History:  Procedure Laterality Date  . EYE SURGERY    . LEG SURGERY  2008/2014   due to fracture has rod Jorge Ferguson  . LUMBAR LAMINECTOMY/DECOMPRESSION MICRODISCECTOMY Right 02/23/2014   Procedure: MICRO LUMBAR DECOMPRESSION L4 - L5 ON THE RIGHT  1 LEVEL;  Surgeon: Jorge Docker, MD;  Location: [facility];  Service: Orthopedics;  Laterality: Right;    No current outpatient medications on file.   No current facility-administered medications for this visit.     Allergies as of 09/18/2017 - Review Complete 09/18/2017  Allergen Reaction Noted    . Azithromycin Hives 09/13/2011  . Hydrocodone bitart (antituss) [hydrocodone] Rash 09/13/2011    Vitals: BP 128/81   Pulse 75   Ht 5\' 11"  (1.803 m)   Wt 210 lb (95.3 kg)   BMI 29.29 kg/m  Last Weight:  Wt Readings from Last 1 Encounters:  09/18/17 210 lb (95.3 kg)   Last Height:   Ht Readings from Last 1 Encounters:  09/18/17 5\' 11"  (1.803 m)    Physical exam:  General: The patient is awake, alert and appears not in acute distress. The patient is well groomed. Head: Normocephalic, atraumatic. Neck is supple.neck circumference 15 Cardiovascular:  Regular rate and rhythm , without  murmurs or carotid bruit, and without distended neck veins. Respiratory: Lungs are clear to auscultation. Skin:  Without evidence of edema, or rash Trunk: BMI is not  elevated and patient  has normal posture.  Neurologic exam : The patient is awake and alert, oriented to place and time.   Mood and affect are appropriate.  Cranial nerves: Pupils are equal and briskly reactive to light. Visual fields by finger perimetry are intact. Hearing to finger rub intact.  Facial sensation intact to fine touch. Facial motor strength is symmetric and tongue and uvula move midline. Tongue protrusion into either cheek is normal. Shoulder shrug is normal.   Motor exam:  Normal tone ,muscle bulk and symmetric strength in all extremities, except for grip- weakness left more than right- the non dominant hand muscle bulk of left thenar eminence is weaker, smaller. Sensory:  Numbness over the left finger tips.  Fine touch, pinprick and vibration were tested in all extremities. Proprioception was normal. Coordination: Rapid alternating movements in the fingers/hands were normal.  Finger-to-nose maneuver  normal without evidence of ataxia, dysmetria or tremor.  Gait and station: Patient walks without assistive device and is able unassisted to climb up to the exam table. Strength within normal limits. Stance is stable and  normal. Tandem gait is unfragmented. He turned with 3 steps.   Deep tendon reflexes: in the upper and lower extremities are absent- attenuated- and intact.   Assessment:  After physical and neurologic examination, review of laboratory studies, imaging, neurophysiology testing and pre-existing records, assessment is that of :   Possible t Dap related acute onset of carpal tunnel syndrome, both hands, but left is worse. painful at night.   Plan:  Treatment plan and additional workup :  Will give 10 day prednisone taper.   Start with  2 times 20 mg po  in AM first 2 days, followed by 30 mg 2 days, followed by 20 mg 2 days, etc.   Wrist support for the night.   EMG and NCV in 2-3 weeks.    Jorge Mylar Mckenize Mezera MD 09/18/2017

## 2017-09-18 NOTE — Patient Instructions (Addendum)
Return for EMG and NCV if your symptoms persist.   Prednisone tablets What is this medicine? PREDNISONE (PRED ni sone) is a corticosteroid. It is commonly used to treat inflammation of the skin, joints, lungs, and other organs. Common conditions treated include asthma, allergies, and arthritis. It is also used for other conditions, such as blood disorders and diseases of the adrenal glands. This medicine may be used for other purposes; ask your health care provider or pharmacist if you have questions. COMMON BRAND NAME(S): Deltasone, Predone, Sterapred, Sterapred DS What should I tell my health care provider before I take this medicine? They need to know if you have any of these conditions: -Cushing's syndrome -diabetes -glaucoma -heart disease -high blood pressure -infection (especially a virus infection such as chickenpox, cold sores, or herpes) -kidney disease -liver disease -mental illness -myasthenia gravis -osteoporosis -seizures -stomach or intestine problems -thyroid disease -an unusual or allergic reaction to lactose, prednisone, other medicines, foods, dyes, or preservatives -pregnant or trying to get pregnant -breast-feeding How should I use this medicine? Take this medicine by mouth with a glass of water. Follow the directions on the prescription label. Take this medicine with food. If you are taking this medicine once a day, take it in the morning. Do not take more medicine than you are told to take. Do not suddenly stop taking your medicine because you may develop a severe reaction. Your doctor will tell you how much medicine to take. If your doctor wants you to stop the medicine, the dose may be slowly lowered over time to avoid any side effects. Talk to your pediatrician regarding the use of this medicine in children. Special care may be needed. Overdosage: If you think you have taken too much of this medicine contact a poison control center or emergency room at  once. NOTE: This medicine is only for you. Do not share this medicine with others. What if I miss a dose? If you miss a dose, take it as soon as you can. If it is almost time for your next dose, talk to your doctor or health care professional. You may need to miss a dose or take an extra dose. Do not take double or extra doses without advice. What may interact with this medicine? Do not take this medicine with any of the following medications: -metyrapone -mifepristone This medicine may also interact with the following medications: -aminoglutethimide -amphotericin B -aspirin and aspirin-like medicines -barbiturates -certain medicines for diabetes, like glipizide or glyburide -cholestyramine -cholinesterase inhibitors -cyclosporine -digoxin -diuretics -ephedrine -male hormones, like estrogens and birth control pills -isoniazid -ketoconazole -NSAIDS, medicines for pain and inflammation, like ibuprofen or naproxen -phenytoin -rifampin -toxoids -vaccines -warfarin This list may not describe all possible interactions. Give your health care provider a list of all the medicines, herbs, non-prescription drugs, or dietary supplements you use. Also tell them if you smoke, drink alcohol, or use illegal drugs. Some items may interact with your medicine. What should I watch for while using this medicine? Visit your doctor or health care professional for regular checks on your progress. If you are taking this medicine over a prolonged period, carry an identification card with your name and address, the type and dose of your medicine, and your doctor's name and address. This medicine may increase your risk of getting an infection. Tell your doctor or health care professional if you are around anyone with measles or chickenpox, or if you develop sores or blisters that do not heal properly. If you are going  to have surgery, tell your doctor or health care professional that you have taken this  medicine within the last twelve months. Ask your doctor or health care professional about your diet. You may need to lower the amount of salt you eat. This medicine may affect blood sugar levels. If you have diabetes, check with your doctor or health care professional before you change your diet or the dose of your diabetic medicine. What side effects may I notice from receiving this medicine? Side effects that you should report to your doctor or health care professional as soon as possible: -allergic reactions like skin rash, itching or hives, swelling of the face, lips, or tongue -changes in emotions or moods -changes in vision -depressed mood -eye pain -fever or chills, cough, sore throat, pain or difficulty passing urine -increased thirst -swelling of ankles, feet Side effects that usually do not require medical attention (report to your doctor or health care professional if they continue or are bothersome): -confusion, excitement, restlessness -headache -nausea, vomiting -skin problems, acne, thin and shiny skin -trouble sleeping -weight gain This list may not describe all possible side effects. Call your doctor for medical advice about side effects. You may report side effects to FDA at 1-800-FDA-1088. Where should I keep my medicine? Keep out of the reach of children. Store at room temperature between 15 and 30 degrees C (59 and 86 degrees F). Protect from light. Keep container tightly closed. Throw away any unused medicine after the expiration date. NOTE: This sheet is a summary. It may not cover all possible information. If you have questions about this medicine, talk to your doctor, pharmacist, or health care provider.  2018 Elsevier/Gold Standard (2010-09-27 10:57:14)

## 2017-10-30 ENCOUNTER — Ambulatory Visit: Payer: BLUE CROSS/BLUE SHIELD | Admitting: Neurology

## 2017-10-30 ENCOUNTER — Ambulatory Visit (INDEPENDENT_AMBULATORY_CARE_PROVIDER_SITE_OTHER): Payer: BLUE CROSS/BLUE SHIELD | Admitting: Neurology

## 2017-10-30 DIAGNOSIS — G5603 Carpal tunnel syndrome, bilateral upper limbs: Secondary | ICD-10-CM | POA: Diagnosis not present

## 2017-10-30 DIAGNOSIS — T50A15A Adverse effect of pertussis vaccine, including combinations with a pertussis component, initial encounter: Secondary | ICD-10-CM

## 2017-10-30 DIAGNOSIS — Z0289 Encounter for other administrative examinations: Secondary | ICD-10-CM

## 2017-10-30 NOTE — Progress Notes (Signed)
History: This is a patient who is here for evaluation of carpal tunnel syndrome.  He was referred by my colleague.  Per review of records, he is a 27 year old right-handed gentleman who experienced an acute onset of bilateral hand and wrist pain that was severe and woke him out of his sleep at night.  Pain is worse at night but also occurs during the day.  The quality of the pain is almost stabbing sensation in his wrist and radiates to digits 1 through 3.  He also reports elbow pain and numbness and weak grip.  Patient also describes similar history to me today, waking up with numb hands and severe pain in the middle of the night.  Discussed findings of EMG nerve conduction study of carpal tunnel syndrome.  Explained carpal tunnel to patient today, explained the physiology and etiologies also discussed therapeutic options which includes conservative measures with wrist splints, steroid injections or carpal tunnel release surgery.  Patient would like to see a physician about these options and he already has a relationship with [facility] and would like to be referred there for his bilateral mild carpal tunnel syndrome.  Orders Placed This Encounter  Procedures  . Ambulatory referral to Orthopedic Surgery   A total of 15 minutes was spent face-to-face with this patient. Over half this time was spent on counseling patient on the CTS diagnosis and different therapeutic options, risks ans benefits of management, and education.  This does not include time spent on EMG nerve conduction study.

## 2017-10-30 NOTE — Procedures (Signed)
Full Name: Esker Dever Gender: Male MRN #: [id number] Date of Birth: 09/11/1990    Visit Date: 10/30/2017 10:41 Age: 27 Years 7 Months Old Examining Physician: Naomie Dean, MD  Referring Physician: Dohmeier, MD  History: Bilateral wrist and hand pain.  Summary:   Nerve Conduction and EMG Studies were performed on the bilateral upper extremities.  The right Median 2nd Digit orthodromic sensory nerve showed prolonged distal peak latency (3.4 ms, N<3.4)   The right median/ulnar (palm) comparison nerve showed prolonged distal peak latency (Median Palm, 2.5 ms, N<2.2) and abnormal peak latency difference (Median Palm-Ulnar Palm, 0.5 ms, N<0.4) with a relative median delay.    The left median/ulnar (palm) comparison nerve showed prolonged distal peak latency (Median Palm, 2.3 ms, N<2.2) and abnormal peak latency difference (Median Palm-Ulnar Palm, 0.4 ms, N<0.4) with a relative median delay.    All remaining nerves within normal limits All muscles within normal limits   Conclusion: This is an abnormal study. There is electrophysiologic evidence of bilateral mild right > left Carpal Tunnel Syndrome.  No suggestion of polyneuropathy or radiculopathy.      Naomie Dean, M.D.  [facility] [address] [place], [place] [address] Tel: [phone] Fax: [phone]        [facility]    Nerve / Sites Muscle Latency Ref. Amplitude Ref. Rel Amp Segments Distance Velocity Ref. Area    ms ms mV mV %  cm m/s m/s mVms  R Median - APB     Wrist APB 3.6 ?4.4 12.0 ?4.0 100 Wrist - APB 7   43.7     Upper arm APB 8.1  11.8  98.8 Upper arm - Wrist 24 54 ?49 42.5  L Median - APB     Wrist APB 3.3 ?4.4 11.0 ?4.0 100 Wrist - APB 7   38.3     Upper arm APB 7.5  10.9  98.7 Upper arm - Wrist 24 57 ?49 38.4  R Ulnar - ADM     Wrist ADM 3.0 ?3.3 12.3 ?6.0 100 Wrist - ADM 7   42.8     B.Elbow ADM 6.9  11.8  96 B.Elbow - Wrist 22 57 ?49 41.7     A.Elbow ADM 8.5  11.4  96 A.Elbow -  B.Elbow 10 60 ?49 40.9         A.Elbow - Wrist      L Ulnar - ADM     Wrist ADM 2.9 ?3.3 10.4 ?6.0 100 Wrist - ADM 7   39.3     B.Elbow ADM 6.9  10.0  95.5 B.Elbow - Wrist   ?49 38.9     A.Elbow ADM 8.7  9.9  99 A.Elbow - B.Elbow   ?49 39.0         A.Elbow - Wrist                 SNC    Nerve / Sites Rec. Site Peak Lat Ref.  Amp Ref. Segments Distance Peak Diff Ref.    ms ms V V  cm ms ms  R Median, Ulnar - Transcarpal comparison     Median Palm Wrist 2.5 ?2.2 64 ?35 Median Palm - Wrist 8       Ulnar Palm Wrist 2.0 ?2.2 17 ?12 Ulnar Palm - Wrist 8          Median Palm - Ulnar Palm  0.5 ?0.4  L Median, Ulnar - Transcarpal comparison  Median Palm Wrist 2.3 ?2.2 82 ?35 Median Palm - Wrist 8       Ulnar Palm Wrist 1.9 ?2.2 34 ?12 Ulnar Palm - Wrist 8          Median Palm - Ulnar Palm  0.4 ?0.4  R Median - Orthodromic (Dig II, Mid palm)     Dig II Wrist 3.4 ?3.4 24 ?10 Dig II - Wrist 13    L Median - Orthodromic (Dig II, Mid palm)     Dig II Wrist 3.1 ?3.4 35 ?10 Dig II - Wrist 13    R Ulnar - Orthodromic, (Dig V, Mid palm)     Dig V Wrist 2.6 ?3.1 9 ?5 Dig V - Wrist 11    L Ulnar - Orthodromic, (Dig V, Mid palm)     Dig V Wrist 2.4 ?3.1 15 ?5 Dig V - Wrist 28                   F  Wave    Nerve F Lat Ref.   ms ms  R Ulnar - ADM 28.9 ?32.0  L Ulnar - ADM 30.5 ?32.0         EMG full       EMG Summary Table    Spontaneous MUAP Recruitment  Muscle IA Fib PSW Fasc Other Amp Dur. Poly Pattern  L. Deltoid Normal None None None _______ Normal Normal Normal Normal  L. Triceps brachii Normal None None None _______ Normal Normal Normal Normal  L. Pronator teres Normal None None None _______ Normal Normal Normal Normal  L. Opponens pollicis Normal None None None _______ Normal Normal Normal Normal  L. First dorsal interosseous Normal None None None _______ Normal Normal Normal Normal  L. Cervical paraspinals (low) Normal None None None _______ Normal Normal Normal Normal

## 2017-10-30 NOTE — Progress Notes (Signed)
Full Name: Jorge Ferguson Gender: Male MRN #: [id number] Date of Birth: 03-Oct-1990    Visit Date: 10/30/2017 10:41 Age: 27 Years 7 Months Old Examining Physician: Naomie Dean, MD  Referring Physician: Dohmeier, MD  History: Bilateral wrist and hand pain.  Summary:  Summary:   Nerve Conduction and EMG Studies were performed on the bilateral upper extremities.  The right Median 2nd Digit orthodromic sensory nerve showed prolonged distal peak latency (3.4 ms, N<3.4)   The right median/ulnar (palm) comparison nerve showed prolonged distal peak latency (Median Palm, 2.5 ms, N<2.2) and abnormal peak latency difference (Median Palm-Ulnar Palm, 0.5 ms, N<0.4) with a relative median delay.    The left median/ulnar (palm) comparison nerve showed prolonged distal peak latency (Median Palm, 2.3 ms, N<2.2) and abnormal peak latency difference (Median Palm-Ulnar Palm, 0.4 ms, N<0.4) with a relative median delay.    All remaining nerves within normal limits All muscles within normal limits  Conclusion: This is an abnormal study. There is electrophysiologic evidence of bilateral mild right > left Carpal Tunnel Syndrome.  No suggestion of polyneuropathy or radiculopathy.      Naomie Dean, M.D.  [facility] [address] [place], [place] [address] Tel: [phone] Fax: [phone]        [facility]    Nerve / Sites Muscle Latency Ref. Amplitude Ref. Rel Amp Segments Distance Velocity Ref. Area    ms ms mV mV %  cm m/s m/s mVms  R Median - APB     Wrist APB 3.6 ?4.4 12.0 ?4.0 100 Wrist - APB 7   43.7     Upper arm APB 8.1  11.8  98.8 Upper arm - Wrist 24 54 ?49 42.5  L Median - APB     Wrist APB 3.3 ?4.4 11.0 ?4.0 100 Wrist - APB 7   38.3     Upper arm APB 7.5  10.9  98.7 Upper arm - Wrist 24 57 ?49 38.4  R Ulnar - ADM     Wrist ADM 3.0 ?3.3 12.3 ?6.0 100 Wrist - ADM 7   42.8     B.Elbow ADM 6.9  11.8  96 B.Elbow - Wrist 22 57 ?49 41.7     A.Elbow ADM 8.5  11.4  96  A.Elbow - B.Elbow 10 60 ?49 40.9         A.Elbow - Wrist      L Ulnar - ADM     Wrist ADM 2.9 ?3.3 10.4 ?6.0 100 Wrist - ADM 7   39.3     B.Elbow ADM 6.9  10.0  95.5 B.Elbow - Wrist   ?49 38.9     A.Elbow ADM 8.7  9.9  99 A.Elbow - B.Elbow   ?49 39.0         A.Elbow - Wrist                 SNC    Nerve / Sites Rec. Site Peak Lat Ref.  Amp Ref. Segments Distance Peak Diff Ref.    ms ms V V  cm ms ms  R Median, Ulnar - Transcarpal comparison     Median Palm Wrist 2.5 ?2.2 64 ?35 Median Palm - Wrist 8       Ulnar Palm Wrist 2.0 ?2.2 17 ?12 Ulnar Palm - Wrist 8          Median Palm - Ulnar Palm  0.5 ?0.4  L Median, Ulnar - Transcarpal comparison  Median Palm Wrist 2.3 ?2.2 82 ?35 Median Palm - Wrist 8       Ulnar Palm Wrist 1.9 ?2.2 34 ?12 Ulnar Palm - Wrist 8          Median Palm - Ulnar Palm  0.4 ?0.4  R Median - Orthodromic (Dig II, Mid palm)     Dig II Wrist 3.4 ?3.4 24 ?10 Dig II - Wrist 13    L Median - Orthodromic (Dig II, Mid palm)     Dig II Wrist 3.1 ?3.4 35 ?10 Dig II - Wrist 13    R Ulnar - Orthodromic, (Dig V, Mid palm)     Dig V Wrist 2.6 ?3.1 9 ?5 Dig V - Wrist 11    L Ulnar - Orthodromic, (Dig V, Mid palm)     Dig V Wrist 2.4 ?3.1 15 ?5 Dig V - Wrist 53                   F  Wave    Nerve F Lat Ref.   ms ms  R Ulnar - ADM 28.9 ?32.0  L Ulnar - ADM 30.5 ?32.0         EMG full       EMG Summary Table    Spontaneous MUAP Recruitment  Muscle IA Fib PSW Fasc Other Amp Dur. Poly Pattern  L. Deltoid Normal None None None _______ Normal Normal Normal Normal  L. Triceps brachii Normal None None None _______ Normal Normal Normal Normal  L. Pronator teres Normal None None None _______ Normal Normal Normal Normal  L. Opponens pollicis Normal None None None _______ Normal Normal Normal Normal  L. First dorsal interosseous Normal None None None _______ Normal Normal Normal Normal  L. Cervical paraspinals (low) Normal None None None _______ Normal Normal Normal Normal

## 2017-10-30 NOTE — Progress Notes (Signed)
See procedure note.

## 2017-12-29 ENCOUNTER — Telehealth: Payer: Self-pay | Admitting: Neurology

## 2017-12-29 NOTE — Telephone Encounter (Signed)
Patient has an apt scheduled with Dr. Melvyn Novas  on 01/27/2018 at 10:00 am

## 2018-02-15 ENCOUNTER — Emergency Department (HOSPITAL_COMMUNITY): Payer: BLUE CROSS/BLUE SHIELD

## 2018-02-15 ENCOUNTER — Emergency Department (HOSPITAL_COMMUNITY)
Admission: EM | Admit: 2018-02-15 | Discharge: 2018-02-15 | Disposition: A | Discharge status: Home / Self Care | Payer: BLUE CROSS/BLUE SHIELD | Attending: Emergency Medicine | Admitting: Emergency Medicine

## 2018-02-15 ENCOUNTER — Encounter (HOSPITAL_COMMUNITY): Payer: Self-pay | Admitting: Emergency Medicine

## 2018-02-15 DIAGNOSIS — R079 Chest pain, unspecified: Secondary | ICD-10-CM | POA: Insufficient documentation

## 2018-02-15 DIAGNOSIS — F17210 Nicotine dependence, cigarettes, uncomplicated: Secondary | ICD-10-CM | POA: Diagnosis not present

## 2018-02-15 DIAGNOSIS — R05 Cough: Secondary | ICD-10-CM | POA: Insufficient documentation

## 2018-02-15 LAB — BASIC METABOLIC PANEL
ANION GAP: 9 (ref 5–15)
BUN: 13 mg/dL (ref 6–20)
CO2: 26 mmol/L (ref 22–32)
Calcium: 9.5 mg/dL (ref 8.9–10.3)
Chloride: 104 mmol/L (ref 98–111)
Creatinine, Ser: 0.65 mg/dL (ref 0.61–1.24)
GFR calc Af Amer: >60 mL/min (ref >60)
GLUCOSE: 88 mg/dL (ref 70–99)
Potassium: 3.8 mmol/L (ref 3.5–5.1)
Sodium: 139 mmol/L (ref 135–145)

## 2018-02-15 LAB — POCT I-STAT TROPONIN I: TROPONIN I, POC: 0 ng/mL (ref 0.00–0.08)

## 2018-02-15 LAB — CBC
HCT: 48.1 % (ref 39.0–52.0)
Hemoglobin: 15.6 g/dL (ref 13.0–17.0)
MCH: 29.2 pg (ref 26.0–34.0)
MCHC: 32.4 g/dL (ref 30.0–36.0)
MCV: 90.1 fL (ref 80.0–100.0)
Platelets: 211 10*3/uL (ref 150–400)
RBC: 5.34 MIL/uL (ref 4.22–5.81)
RDW: 12.1 % (ref 11.5–15.5)
WBC: 7.7 10*3/uL (ref 4.0–10.5)
nRBC: 0 % (ref 0.0–0.2)

## 2018-02-15 MED ORDER — IBUPROFEN 600 MG PO TABS: 600.0000 mg | ORAL_TABLET | Freq: Three times a day (TID) | ORAL | 0 refills | Status: AC | PRN

## 2018-02-15 MED ORDER — IBUPROFEN 200 MG PO TABS
600.0000 mg | ORAL_TABLET | Freq: Once | ORAL | Status: AC
  Administered 2018-02-15: 600 mg via ORAL
  Filled 2018-02-15: qty 3

## 2018-02-15 NOTE — Discharge Instructions (Signed)
If you develop severe worsening chest pain, shortness of breath, significant cough or coughing up blood, or any other new/concerning symptoms then return to the ER for evaluation.

## 2018-02-15 NOTE — ED Triage Notes (Signed)
Pt c/o center to left chest pains that started thursday. reports pains are constant but get worse at night when lying down. [facility] sent from [facility] due to abnormal EKG.

## 2018-02-15 NOTE — ED Provider Notes (Signed)
[facility]-EMERGENCY DEPT Provider Note   CSN: [id number] Arrival date & time: 02/15/18  1213     History   Chief Complaint Chief Complaint  Patient presents with  . Chest Pain    HPI Jorge Ferguson is a 27 y.o. male.  HPI  27 year old male presents with chest pain.  Started on 12/19.  He states the pain is sharp and essentially constant but will intermittently increase without obvious cause.  He feels a wave of pain going to his back but no severe pain going to his back.  There is no abdominal pain.  He denies any shortness of breath.  He states he has a mild cough.  Has not take anything for the pain.  The pain is particularly worst at night and he rates it as about 6 or 8 out of 10 when laying in bed.  However currently it is basically gone.  Sometimes he will have some pain in his left back when he takes a deep breath.  He denies any recent travel or surgery and no unilateral leg swelling.  He is a smoker but denies any other past medical history or family history for heart disease.  Sometimes when he rolls over onto his right side the pain will worsen or when he lies flat on his back.  No pain with eating.  Past Medical History:  Diagnosis Date  . H. pylori infection 2018   + UBT, negative f/u test  . HNP (herniated nucleus pulposus), lumbar     Patient Active Problem List   Diagnosis Date Noted  . Carpal tunnel syndrome on both sides 09/18/2017  . Adverse reaction to diphtheria, tetanus, and pertussis vaccine 09/18/2017  . HNP (herniated nucleus pulposus), lumbar 02/23/2014    Past Surgical History:  Procedure Laterality Date  . EYE SURGERY    . LEG SURGERY  2008/2014   due to fracture has rod [name]  . LUMBAR LAMINECTOMY/DECOMPRESSION MICRODISCECTOMY Right 02/23/2014   Procedure: MICRO LUMBAR DECOMPRESSION L4 - L5 ON THE RIGHT  1 LEVEL;  Surgeon: [name], MD;  Location: [facility] ORS;  Service: Orthopedics;  Laterality: Right;        Home  Medications    Prior to Admission medications   Medication Sig Start Date End Date Taking? Authorizing Provider  ibuprofen (ADVIL,MOTRIN) 600 MG tablet Take 1 tablet (600 mg total) by mouth every 8 (eight) hours as needed. 02/15/18   Pricilla Loveless, MD  omeprazole (PRILOSEC) 20 MG capsule Take 1 capsule (20 mg total) by mouth daily. 02/20/17 02/24/17  [name], PA-C    Family History Family History  Problem Relation Age of Onset  . Diabetes Mother   . Hyperlipidemia Mother   . Hypertension Father   . Hyperlipidemia Father     Social History Social History   Tobacco Use  . Smoking status: Current Every Day Smoker    Packs/day: 0.25    Types: Cigarettes    Start date: 07/10/2017  . Smokeless tobacco: Never Used  Substance Use Topics  . Alcohol use: No  . Drug use: No     Allergies   Azithromycin and Hydrocodone bitart (antituss) [hydrocodone]   Review of Systems Review of Systems  Constitutional: Negative for fever.  Respiratory: Negative for shortness of breath.   Cardiovascular: Positive for chest pain. Negative for leg swelling.  Gastrointestinal: Negative for abdominal pain and vomiting.  All other systems reviewed and are negative.    Physical Exam Updated Vital Signs  BP 109/67   Pulse (!) 51   Temp 97.9 F (36.6 C) (Oral)   Resp 18   Ht 5\' 11"  (1.803 m)   Wt 93.9 kg   SpO2 100%   BMI 28.87 kg/m   Physical Exam Vitals signs and nursing note reviewed.  Constitutional:      Appearance: He is well-developed.     Comments: Sitting up, resting comfortably  HENT:     Head: Normocephalic and atraumatic.     Right Ear: External ear normal.     Left Ear: External ear normal.     Nose: Nose normal.  Eyes:     General:        Right eye: No discharge.        Left eye: No discharge.  Neck:     Musculoskeletal: Neck supple.  Cardiovascular:     Rate and Rhythm: Normal rate and regular rhythm.     Heart sounds: Normal heart sounds.    Pulmonary:     Effort: Pulmonary effort is normal.     Breath sounds: Normal breath sounds.  Chest:     Chest wall: No tenderness.  Abdominal:     Palpations: Abdomen is soft.     Tenderness: There is no abdominal tenderness.  Musculoskeletal:     Right lower leg: No edema.     Left lower leg: No edema.  Skin:    General: Skin is warm and dry.  Neurological:     Mental Status: He is alert.  Psychiatric:        Mood and Affect: Mood is not anxious.        ED Treatments / Results  Labs (all labs ordered are listed, but only abnormal results are displayed) Labs Reviewed  BASIC METABOLIC PANEL  CBC  I-STAT TROPONIN, ED  POCT I-STAT TROPONIN I    EKG EKG Interpretation  Date/Time:  Sunday February 15 2018 12:18:57 EST Ventricular Rate:  62 PR Interval:    QRS Duration: 99 QT Interval:  400 QTC Calculation: 407 R Axis:   92 Text Interpretation:  Sinus rhythm Borderline right axis deviation Baseline wander in lead(s) I III aVL no acute ST/T changes No old tracing to compare Confirmed by Pricilla LovelessGoldston, Nihal Marzella [phone]([id number]) on 02/15/2018 12:51:18 PM   Radiology Dg Chest 2 View  Result Date: 02/15/2018 CLINICAL DATA:  Chest pain radiating to neck.  Abnormal EKG. EXAM: CHEST - 2 VIEW COMPARISON:  09/15/2017 FINDINGS: The heart size and mediastinal contours are within normal limits. Both lungs are clear. The visualized skeletal structures are unremarkable. IMPRESSION: Negative.  No active cardiopulmonary disease. Electronically Signed   By: Myles RosenthalJohn  Stahl M.D.   On: 02/15/2018 13:24    Procedures Procedures (including critical care time)  Medications Ordered in ED Medications  ibuprofen (ADVIL,MOTRIN) tablet 600 mg (600 mg Oral Given 02/15/18 1337)     Initial Impression / Assessment and Plan / ED Course  I have reviewed the triage vital signs and the nursing notes.  Pertinent labs & imaging results that were available during my care of the patient were reviewed by me and  considered in my medical decision making (see chart for details).     Patient has nonspecific chest pain but given his age and lack of risk factors beside smoking I have very low suspicion for ACS.  Highly unlikely to have dissection.  Low risk for PE and he is PERC negative.  Given all this, he did have a screening troponin given the  length of time of symptoms and this and his other blood work are benign.  He was sent over from urgent care for a "abnormal ECG".  However there are no ischemic changes on this ECG from urgent care or the one here.  This is probably muscular and I have advised him to use ibuprofen.  He can follow-up with PCP.  Discussed return precautions.  Final Clinical Impressions(s) / ED Diagnoses   Final diagnoses:  Nonspecific chest pain    ED Discharge Orders         Ordered    ibuprofen (ADVIL,MOTRIN) 600 MG tablet  Every 8 hours PRN     02/15/18 1430           Pricilla LovelessGoldston, William Laske, MD 02/15/18 1521

## 2018-06-12 ENCOUNTER — Ambulatory Visit (HOSPITAL_COMMUNITY)
Admission: RE | Admit: 2018-06-12 | Payer: BLUE CROSS/BLUE SHIELD | Source: Home / Self Care | Admitting: Orthopedic Surgery

## 2018-06-12 ENCOUNTER — Encounter (HOSPITAL_COMMUNITY): Admission: RE | Payer: Self-pay | Source: Home / Self Care

## 2018-06-12 SURGERY — REMOVAL, HARDWARE
Anesthesia: LOCAL | Laterality: Left
